# Patient Record
Sex: Female | Born: 1965 | Race: White | Hispanic: Yes | Marital: Married | Smoking: Never smoker
Health system: Southern US, Community
[De-identification: ages and names within clinical notes are randomized; demographics above are authoritative.]

## PROBLEM LIST (undated history)

## (undated) DIAGNOSIS — I10 Essential (primary) hypertension: Secondary | ICD-10-CM

## (undated) DIAGNOSIS — F419 Anxiety disorder, unspecified: Secondary | ICD-10-CM

## (undated) HISTORY — DX: Essential (primary) hypertension: I10

## (undated) HISTORY — DX: Anxiety disorder, unspecified: F41.9

---

## 2007-09-21 ENCOUNTER — Ambulatory Visit: Payer: Self-pay | Admitting: Cardiology

## 2007-09-21 ENCOUNTER — Encounter: Payer: Self-pay | Admitting: Cardiology

## 2007-09-21 ENCOUNTER — Ambulatory Visit: Payer: Self-pay

## 2010-12-14 NOTE — Assessment & Plan Note (Signed)
[facility]                            CARDIOLOGY OFFICE NOTE   NAME:[name], [name]                      MRN:          [id number]  DATE:09/21/2007                            DOB:          05-18-1966    REFERRING PHYSICIAN:  Dr. Thayer Ohm Guest.   REASON FOR REFERRAL:  Evaluate patient with a murmur and questionable  TIA.   HISTORY OF PRESENT ILLNESS:  The patient is a pleasant 45 year old  female, originally from [place].  She has had no prior cardiac history.  This morning she awoke with a little headache.  She thought maybe this  was because her menstrual period was starting.  However, she went to  work.  She had left arm and left leg numbness.  She did not describe  weakness.  She had no loss of vision or speech.  She said her head felt  funny and cold.  This all went on for about 20 minutes.  She went to  Urgent Care.  Her symptoms resolved, except she says she still feels a  little vague sense of discomfort in the left arm.  At the Urgent Care,  she was noted to have right-sided bruit versus murmur; she was referred  here.  We have done a head CT, noncontrasted, which shows no acute  findings preliminarily.  Also, an echocardiogram demonstrates no  valvular abnormalities, well-preserved ejection fraction and no other  significant findings.   The patient says she is otherwise well.  She has had no weakness,  palpitations, presyncope, chest discomfort, neck or arm discomfort.  She  is active.  She has no shortness of breath and denies any PND or  orthopnea.  She has never had these kind of symptoms before.   PAST MEDICAL HISTORY:  She has no history of hypertension, diabetes or  hyperlipidemia.   PAST SURGICAL HISTORY:  None.   ALLERGIES:  None.   MEDICATIONS:  None.   SOCIAL HISTORY:  The patient is married.  She has a 45 year old and 35-  year-old child.  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  She has never smoked  cigarettes.  She does not drink  alcohol.   FAMILY HISTORY:  Only contributory for her mother having hypertension.  There is no history of strokes, coronary disease or other vascular  problems.   REVIEW OF SYSTEMS:  As stated in the HPI and otherwise negative for all  other systems.  She did start her period today.   PHYSICAL EXAMINATION:  GENERAL:  The patient is in no distress.  Blood  pressure 131/93, heart rate 83 and regular.  HEENT:  Eyelids are unremarkable; pupils equal, round and reactive to  light; fundi within normal limits.  Oral mucosa remarkable.  NECK:  No jugular distention at 45 degrees; carotid upstroke brisk and  symmetric; right carotid, loud bruit, no left bruit; no thyromegaly.  LYMPHATICS:  No cervical, axillary or inguinal adenopathy.  LUNGS:  Clear to auscultation bilaterally.  BACK:  No costovertebral angle tenderness.  CHEST:  Unremarkable.  HEART:  PMI not displaced or sustained, S1 and S2 within normal limits,  no  S3, no S4, no clicks, no rubs, no murmurs.  ABDOMEN:  Flat.  Positive  bowel sounds, normal in frequency and pitch.  No bruits, no rebound, no  guarding, no midline pulsatile mass, no hepatomegaly, no splenomegaly.  SKIN:  No rashes, no nodules.  EXTREMITIES:  Pulses 2+ throughout, no edema, no cyanosis, no clubbing.  NEUROLOGIC:  Oriented to person, place and time, cranial nerves II-XII  grossly intact, motor intact throughout, sensory grossly intact.   IMPRESSION:  1. _________  carotid bruit.  The patient had the symptoms as      described above.  This could of been a transient ischemic attack,      though nothing was found on CT.  She has a normal neurological exam      now and her symptoms have almost completely resolved, though there      is still some vague sense of discomfort in that left upper arm.      Most interestingly, she does have a loud carotid bruit.  There is      no murmur and the echocardiogram was normal.  She would not be      somebody I would expect to  have obstructive disease, but could      have, but certainly we need to explain the bruit with a carotid      Doppler.  I have asked her to start taking an aspirin.  We are      going to get the carotid Doppler as soon as possible.  She has been      advised that if she has any further symptoms, she needs to present      immediately to the emergency room.  2. Risk reduction:  We can think about a lipid profile at some point      when she is fasting, as she has never had this done.  3.  Followup      will be after the carotid Doppler, or if she has any further      symptoms.     Rollene Rotunda, MD, [facility]  Electronically Signed    JH/MedQ  DD: 09/21/2007  DT: 09/23/2007  Job #: [id number]   cc:   Jonita Albee, M.D.  Rollene Rotunda, MD, [facility]

## 2011-02-22 ENCOUNTER — Ambulatory Visit: Payer: Self-pay | Admitting: Gynecology

## 2011-10-09 ENCOUNTER — Ambulatory Visit: Payer: Self-pay | Admitting: Family Medicine

## 2011-10-09 VITALS — BP 136/85 | HR 93 | Temp 98.1°F | Resp 16 | Ht 65.0 in | Wt 165.0 lb

## 2011-10-09 DIAGNOSIS — I10 Essential (primary) hypertension: Secondary | ICD-10-CM | POA: Insufficient documentation

## 2011-10-09 DIAGNOSIS — F419 Anxiety disorder, unspecified: Secondary | ICD-10-CM

## 2011-10-09 DIAGNOSIS — F418 Other specified anxiety disorders: Secondary | ICD-10-CM | POA: Insufficient documentation

## 2011-10-09 MED ORDER — TRIAMTERENE-HCTZ 37.5-25 MG PO TABS: 1.0000 | ORAL_TABLET | Freq: Every day | ORAL | Status: DC

## 2011-10-09 MED ORDER — AMLODIPINE BESYLATE 2.5 MG PO TABS: 2.5000 mg | ORAL_TABLET | Freq: Every day | ORAL | Status: DC

## 2011-10-09 MED ORDER — ALPRAZOLAM 0.25 MG PO TABS: 0.5000 mg | ORAL_TABLET | Freq: Every day | ORAL | Status: DC

## 2011-10-09 MED ORDER — CLONAZEPAM 1 MG PO TABS: 1.0000 mg | ORAL_TABLET | Freq: Every day | ORAL | Status: DC

## 2011-10-09 NOTE — Progress Notes (Signed)
Is a 46 year old woman who comes in with her husband. She is chronically anxious and needs her blood pressure medicine refilled. She speaks only Spanish and she no she needs a physical exam. Her main complaint is her anxiety. The clonazepam is helping her sleep and has been taking care of well since she started the clonazepam. For the past 6 months she's developed intermittent problems during the day particularly at work related to anxiety with her boss. Which takes the Xanax when necessary the symptoms immediately resolved.  Patient is anxious today but she calms down after being and we'll talk for a while. She notes that she gets tachycardic at work sometimes, but as noted the Xanax calms things down after 20 minutes.  Objective: HEENT unremarkable chest clear heart regular no murmur  Assessment: Chronic anxiety, blood pressure controlled  Plan: Labs ordered for complete physical exam on March 18 polypi complete physical exam on March 20  Meds refilled

## 2011-10-17 ENCOUNTER — Telehealth: Payer: Self-pay

## 2011-10-17 ENCOUNTER — Other Ambulatory Visit: Payer: Self-pay

## 2011-10-17 DIAGNOSIS — F419 Anxiety disorder, unspecified: Secondary | ICD-10-CM

## 2011-10-17 DIAGNOSIS — I10 Essential (primary) hypertension: Secondary | ICD-10-CM

## 2011-10-17 LAB — COMPREHENSIVE METABOLIC PANEL
ALT: 17 U/L (ref 0–35)
AST: 16 U/L (ref 0–37)
Albumin: 4.5 g/dL (ref 3.5–5.2)
Alkaline Phosphatase: 72 U/L (ref 39–117)
BUN: 16 mg/dL (ref 6–23)
CO2: 28 mEq/L (ref 19–32)
Calcium: 9.3 mg/dL (ref 8.4–10.5)
Chloride: 102 mEq/L (ref 96–112)
Creat: 0.56 mg/dL (ref 0.50–1.10)
Glucose, Bld: 91 mg/dL (ref 70–99)
Potassium: 3.9 mEq/L (ref 3.5–5.3)
Sodium: 139 mEq/L (ref 135–145)
Total Bilirubin: 0.4 mg/dL (ref 0.3–1.2)
Total Protein: 7.3 g/dL (ref 6.0–8.3)

## 2011-10-17 LAB — CBC WITH DIFFERENTIAL/PLATELET
Basophils Absolute: 0 10*3/uL (ref 0.0–0.1)
Basophils Relative: 0 % (ref 0–1)
Eosinophils Absolute: 0.1 10*3/uL (ref 0.0–0.7)
Eosinophils Relative: 1 % (ref 0–5)
HCT: 40.5 % (ref 36.0–46.0)
Hemoglobin: 13 g/dL (ref 12.0–15.0)
Lymphocytes Relative: 26 % (ref 12–46)
Lymphs Abs: 1.8 10*3/uL (ref 0.7–4.0)
MCH: 27.7 pg (ref 26.0–34.0)
MCHC: 32.1 g/dL (ref 30.0–36.0)
MCV: 86.2 fL (ref 78.0–100.0)
Monocytes Absolute: 0.6 10*3/uL (ref 0.1–1.0)
Monocytes Relative: 8 % (ref 3–12)
Neutro Abs: 4.4 10*3/uL (ref 1.7–7.7)
Neutrophils Relative %: 64 % (ref 43–77)
Platelets: 308 10*3/uL (ref 150–400)
RBC: 4.7 MIL/uL (ref 3.87–5.11)
RDW: 13.4 % (ref 11.5–15.5)
WBC: 6.9 10*3/uL (ref 4.0–10.5)

## 2011-10-17 LAB — TSH: TSH: 2.251 u[IU]/mL (ref 0.350–4.500)

## 2011-10-17 LAB — LIPID PANEL
Cholesterol: 227 mg/dL — ABNORMAL HIGH (ref 0–200)
HDL: 47 mg/dL (ref >39)
LDL Cholesterol: 148 mg/dL — ABNORMAL HIGH (ref 0–99)
Total CHOL/HDL Ratio: 4.8 Ratio
Triglycerides: 161 mg/dL — ABNORMAL HIGH (ref <150)
VLDL: 32 mg/dL (ref 0–40)

## 2011-11-07 ENCOUNTER — Ambulatory Visit: Payer: Self-pay | Admitting: Family Medicine

## 2011-11-07 VITALS — BP 131/92 | HR 101 | Temp 98.2°F | Resp 16 | Ht 65.0 in | Wt 163.0 lb

## 2011-11-07 DIAGNOSIS — J209 Acute bronchitis, unspecified: Secondary | ICD-10-CM

## 2011-11-07 DIAGNOSIS — J40 Bronchitis, not specified as acute or chronic: Secondary | ICD-10-CM

## 2011-11-07 DIAGNOSIS — R059 Cough, unspecified: Secondary | ICD-10-CM

## 2011-11-07 DIAGNOSIS — R05 Cough: Secondary | ICD-10-CM

## 2011-11-07 MED ORDER — GUAIFENESIN-CODEINE 100-10 MG/5ML PO SYRP: ORAL_SOLUTION | ORAL | Status: DC

## 2011-11-07 MED ORDER — AZITHROMYCIN 250 MG PO TABS: ORAL_TABLET | ORAL | Status: DC

## 2011-11-07 NOTE — Progress Notes (Signed)
  Subjective:    Patient ID: Carolynne Edouard, female    DOB: Sep 12, 1965, 46 y.o.   MRN: [id number]  HPI Genean Adamski is a 46 y.o. female Cough and nasal congestion for 2 weeks, now with sublective fever past 2 days, worse cough past 2 days. SH:  No known sick contacts.  Tx; Dayquil. Review of Systems  Constitutional: Positive for fever and chills.  HENT: Positive for ear pain and congestion. Negative for ear discharge.   Respiratory: Positive for cough. Negative for shortness of breath.   Cardiovascular: Negative for chest pain.  Musculoskeletal: Negative for arthralgias.       Objective:   Physical Exam  Constitutional: She is oriented to person, place, and time. She appears well-developed and well-nourished.  HENT:  Head: Normocephalic and atraumatic.  Eyes: Conjunctivae and EOM are normal. Pupils are equal, round, and reactive to light.  Cardiovascular: Normal rate, regular rhythm, normal heart sounds and intact distal pulses.   No murmur heard. Pulmonary/Chest: Effort normal. No respiratory distress.       Few coarse b.s. RLL, normal effort, no distress.  Neurological: She is alert and oriented to person, place, and time.  Skin: Skin is warm and dry. No rash noted.  Psychiatric: She has a normal mood and affect. Her behavior is normal.      Assessment & Plan:  Rashawna Scoles is a 46 y.o. female Cough, uri with secondary worsening,  Likely early community acquired pneumonia, vs bronchitis.   Start Z pak.  Mucinex QAM, Robitussin AC QHS prn (do not combine with xanax, SED). CXR declined tonight, but if not improving in next 2-3 days, or worsening sooner - return to clinic.  RTC precautions discussed, part of history and A/P discussed in spanish.

## 2012-03-31 ENCOUNTER — Ambulatory Visit: Payer: Self-pay | Admitting: Family Medicine

## 2012-03-31 VITALS — BP 124/82 | HR 82 | Temp 98.1°F | Resp 20 | Ht 65.0 in | Wt 160.0 lb

## 2012-03-31 DIAGNOSIS — F411 Generalized anxiety disorder: Secondary | ICD-10-CM

## 2012-03-31 DIAGNOSIS — F419 Anxiety disorder, unspecified: Secondary | ICD-10-CM

## 2012-03-31 DIAGNOSIS — E7800 Pure hypercholesterolemia, unspecified: Secondary | ICD-10-CM

## 2012-03-31 DIAGNOSIS — I10 Essential (primary) hypertension: Secondary | ICD-10-CM

## 2012-03-31 MED ORDER — ALPRAZOLAM 0.25 MG PO TABS: 0.2500 mg | ORAL_TABLET | Freq: Three times a day (TID) | ORAL | Status: AC | PRN

## 2012-03-31 MED ORDER — TRIAMTERENE-HCTZ 37.5-25 MG PO TABS: 1.0000 | ORAL_TABLET | Freq: Every day | ORAL | Status: DC

## 2012-03-31 MED ORDER — AMLODIPINE BESYLATE 2.5 MG PO TABS: 2.5000 mg | ORAL_TABLET | Freq: Every day | ORAL | Status: DC

## 2012-03-31 MED ORDER — CLONAZEPAM 1 MG PO TABS: ORAL_TABLET | ORAL | Status: DC

## 2012-03-31 NOTE — Progress Notes (Signed)
  Subjective:    Patient ID: [name] [name], female    DOB: [birth year], 45 y.o.   MRN: [id number]  HPI patient here for recheck of hypertension and anxiety she is doing well on her medications. She reports her anxiety is better with the use of Xanax and Klonopin.     Review of Systems     Objective:   Physical Exam Well developed well nourished female in no acute distress. She is not anxious appearing on exam and states she is well. Abdomen soft and non tender. Breath sounds normal bilaterally.  Heart regular without murmur      Assessment & Plan:  Maxide 25 and Norvasc 2.5 are renewed patient is also given Xanax to use prn and Klonopin for use at bedtime.  Patient can have meds refilled for one year if I am not available in March when she calls for refill.

## 2012-07-26 ENCOUNTER — Ambulatory Visit: Payer: Self-pay | Admitting: Family Medicine

## 2012-07-26 VITALS — BP 133/80 | HR 87 | Temp 98.2°F | Resp 16 | Ht 66.3 in | Wt 161.6 lb

## 2012-07-26 DIAGNOSIS — M549 Dorsalgia, unspecified: Secondary | ICD-10-CM

## 2012-07-26 DIAGNOSIS — M545 Low back pain, unspecified: Secondary | ICD-10-CM

## 2012-07-26 LAB — POCT URINALYSIS DIPSTICK
Bilirubin, UA: NEGATIVE
Blood, UA: NEGATIVE
Glucose, UA: NEGATIVE
Ketones, UA: NEGATIVE
Nitrite, UA: NEGATIVE
Protein, UA: NEGATIVE
Spec Grav, UA: <=1.005
Urobilinogen, UA: 0.2
pH, UA: 5

## 2012-07-26 LAB — POCT UA - MICROSCOPIC ONLY
Casts, Ur, LPF, POC: NEGATIVE
Crystals, Ur, HPF, POC: NEGATIVE
Mucus, UA: NEGATIVE
RBC, urine, microscopic: NEGATIVE
Renal tubular cells: POSITIVE
Yeast, UA: NEGATIVE

## 2012-07-26 LAB — POCT URINE PREGNANCY: Preg Test, Ur: NEGATIVE

## 2012-07-26 MED ORDER — KETOPROFEN 50 MG PO CAPS: 50.0000 mg | ORAL_CAPSULE | Freq: Four times a day (QID) | ORAL | Status: DC | PRN

## 2012-07-26 NOTE — Patient Instructions (Addendum)
Distensin lumbo-sacra (Lumbosacral Strain) Distensin lumbo-sacra es una de las causas ms frecuentes de dolor de espalda. Hay numerosas causas que originan el dolor de espalda. La mayora no son enfermedades graves.  CAUSAS La columna vertebral (espina dorsal) est conformada por 24 vrtebras principales, adems del sacro y el cccix que estn unidas entre s por tejidos fibrosos y resistentes denominados ligamentos, y sostenidas por los msculos. Las races nerviosas pasan a travs de los agujeros entre las vrtebras. Un movimiento brusco o un traumatismo en la espalda pueden ocasionar daos a estos nervios o presin sobre ellos. Esto puede traer como consecuencia un dolor localizado en la espalda o un dolor que se irradia (se desplaza) hacia los glteos y por la pierna hasta el pie. Este trastorno, conocido como citica, est asociado frecuentemente a una ruptura (hernia) del disco. El dolor tambin puede estar causado slo por el espasmo muscular. El mdico a menudo podr encontrar la causa del dolor a partir de la informacin detallada de los sntomas y un examen fsico. En algunos casos, podr necesitar estudios (como radiografas), pero podran no ser los adecuados para usted. El profesional que lo asiste determinar si se necesitan otros estudios, segn el examen fsico especfico. INSTUCCIONES PARA EL CUIDADO DOMICILIARIO:  Evite el estilo de vida sedentario. El ejercicio activo, siguiendo las indicaciones del profesional que le asiste, es el arma ms importante con que cuenta para luchar contra el dolor de espalda.  Si no se encuentra en buen estado fsico, evite las actividades intensas (tenis, frontn, esqu acutico). Esto podra crear o agravar el problema.  Si siente dolor en la espalda, es importante evitar los deportes que requieran de movimientos corporales bruscos. La natacin y las caminatas son las actividades ms seguras.  Mantenga una buena postura.  Evite el sobrepeso  (obesidad).  Haga reposo en cama slo en caso de los episodios repentinos ms extremos y agudos. El profesional que lo asiste ayudar a determinar cunto reposo le conviene.  En los casos agudos, coloque hielo en la zona lesionada.  Ponga el hielo en una bolsa plstica.  Colquese una toalla entre la piel y la bolsa.  Aplique el hielo 15 a 20 minutos por vez, cada dos horas o segn sea necesario.  Una vez que ha mejorado y est ms activo, ser til aplicar calor durante 30 minutos antes de las actividades. Consulte con un mdico si el dolor se prolonga ms de lo esperado. El mdico podr ayudar o aconsejarle ejercicios adecuados o terapia si esta es necesaria. Con un buen entrenamiento fsico, podr evitar la mayor parte de los problemas de la espalda.  SOLICITE ATENCIN MDICA DE INMEDIATO SI:  Presenta entumecimiento, hormigueo, debilidad o algn problema en los brazos o las piernas.  Siente un dolor de espalda intenso que no mejora con medicamentos.  Observa cambios en el control del intestino o la vejiga.  Usted presenta dolor en algunas zonas del cuerpo que incluyen el estmago o el abdomen.  Sufre falta de aire, mareos o desmayos.  Comienza a sentir nuseas (ganas de vomitar), vmitos o sudoracin.  Decoloracin de sus dedos o piernas o pies que se ponen muy fros.  El dolor de espalda empeora.  Tiene fiebre. EST SEGURO QUE:   Comprende las instrucciones para el alta mdica.  Controlar su enfermedad.  Solicitar atencin mdica de inmediato segn las indicaciones. Document Released: 04/27/2005 Document Revised: 10/10/2011 [employer] Patient Information 2013 [employer].  

## 2012-07-26 NOTE — Progress Notes (Signed)
This is a 46 year old Hispanic woman comes in because of low back pain which migrates from side to side of the last 2 weeks. She seen with her husband and the room. She has no urinary symptoms. She's had no injury. The pain is worse when she bends over she does not have any pain radiating down her legs and she has no abdominal symptoms such as nausea vomiting or diarrhea. She was in the bathroom #2 twice a day. The pain seemed to be burning in nature in the morning but then becomes an ache later in the day. He's had no weakness in the legs.  Objective: No acute distress SLR normal Patient can bend over and touch her toes without problem. She has no localized tenderness in her back  Range of motion of both legs is normal Results for orders placed in visit on 07/26/12  POCT URINALYSIS DIPSTICK      Component Value Range   Color, UA clear     Clarity, UA clear     Glucose, UA neg     Bilirubin, UA neg     Ketones, UA neg     Spec Grav, UA <=1.005     Blood, UA neg     pH, UA 5.0     Protein, UA neg     Urobilinogen, UA 0.2     Nitrite, UA neg     Leukocytes, UA small (1+)    POCT UA - MICROSCOPIC ONLY      Component Value Range   WBC, Ur, HPF, POC 1-6     RBC, urine, microscopic neg     Bacteria, U Microscopic trace     Mucus, UA neg     Epithelial cells, urine per micros 3-16     Crystals, Ur, HPF, POC neg     Casts, Ur, LPF, POC neg     Yeast, UA neg     Renal tubular cells positive    POCT URINE PREGNANCY      Component Value Range   Preg Test, Ur Negative       Assessment: muscular type pain, possibly a swollen disc  Plan

## 2012-07-28 LAB — URINE CULTURE: Colony Count: >=100000

## 2012-07-30 ENCOUNTER — Telehealth: Payer: Self-pay

## 2012-07-30 NOTE — Telephone Encounter (Signed)
Message copied by Johnnette Litter on Mon Jul 30, 2012  8:20 PM ------      Message from: [name]      Created: Sun Jul 29, 2012  8:16 PM       Please notify patient of positive urine culture and importance of finishing antibiotics.  Return if symptoms continue.

## 2012-07-30 NOTE — Telephone Encounter (Signed)
Dr. Elbert Ewings, pt was never put on an abx. She is feeling a little better with the Ketoprofen, but wants to know if you think she should take an abx.

## 2012-07-31 NOTE — Telephone Encounter (Signed)
Patient should be rechecked at this point

## 2012-08-03 NOTE — Telephone Encounter (Signed)
Spoke to pt and she stated she is feeling better and Sxs have resolved at this point. I advised pt to RTC for recheck if her Sxs return. Pt agreed.

## 2012-09-01 ENCOUNTER — Other Ambulatory Visit: Payer: Self-pay | Admitting: Family Medicine

## 2012-09-01 NOTE — Telephone Encounter (Signed)
DR. Milus Glazier TOLD PATIENT HE WAS GOING TO REFILL HER XANAX ONE MORE TIME BUT SHE HAS NO REFILL AT PHARMACY. COULD SOMEONE CALL IT IN? SHE SAYS SHE BEEN TAKING TWO DAILY AND HAS RAN OUT

## 2012-09-02 ENCOUNTER — Other Ambulatory Visit: Payer: Self-pay | Admitting: Family Medicine

## 2012-09-03 ENCOUNTER — Ambulatory Visit (INDEPENDENT_AMBULATORY_CARE_PROVIDER_SITE_OTHER): Payer: BC Managed Care – PPO | Admitting: Family Medicine

## 2012-09-03 VITALS — BP 127/88 | HR 80 | Temp 97.9°F | Resp 16 | Ht 66.0 in | Wt 156.0 lb

## 2012-09-03 DIAGNOSIS — G4700 Insomnia, unspecified: Secondary | ICD-10-CM

## 2012-09-03 DIAGNOSIS — F410 Panic disorder [episodic paroxysmal anxiety] without agoraphobia: Secondary | ICD-10-CM

## 2012-09-03 MED ORDER — CLONAZEPAM 1 MG PO TABS: 1.0000 mg | ORAL_TABLET | Freq: Every day | ORAL | Status: DC

## 2012-09-03 MED ORDER — BUSPIRONE HCL 5 MG PO TABS: 5.0000 mg | ORAL_TABLET | Freq: Two times a day (BID) | ORAL | Status: DC | PRN

## 2012-09-03 NOTE — Progress Notes (Signed)
47 year old Hispanic woman who is chronically anxious. I had her on clonazepam for sleep which is working well. Nevertheless the alprazolam pacemaker little drowsy and does not really work controlling the palpitations which occur only in the afternoon. She's find her in the morning. She describes as panic attack like. She has no GERD symptoms.  Objective: Lungs clear Heart: Regular no murmur or gallop  Assessment panic disorder  Plan: Refill clonazepam, trial of BuSpar 5 mg twice a day when necessary. I've asked her to take 1 every day at noon and repeat later in the afternoon necessary

## 2013-02-17 ENCOUNTER — Ambulatory Visit (INDEPENDENT_AMBULATORY_CARE_PROVIDER_SITE_OTHER): Payer: BC Managed Care – PPO | Admitting: Family Medicine

## 2013-02-17 ENCOUNTER — Telehealth: Payer: Self-pay | Admitting: Family Medicine

## 2013-02-17 VITALS — BP 122/74 | HR 79 | Temp 98.0°F | Resp 17 | Ht 65.5 in | Wt 161.0 lb

## 2013-02-17 DIAGNOSIS — I10 Essential (primary) hypertension: Secondary | ICD-10-CM

## 2013-02-17 DIAGNOSIS — G4700 Insomnia, unspecified: Secondary | ICD-10-CM

## 2013-02-17 DIAGNOSIS — F410 Panic disorder [episodic paroxysmal anxiety] without agoraphobia: Secondary | ICD-10-CM

## 2013-02-17 DIAGNOSIS — R011 Cardiac murmur, unspecified: Secondary | ICD-10-CM

## 2013-02-17 LAB — POCT CBC
Granulocyte percent: 64 %G (ref 37–80)
HCT, POC: 36.1 % — AB (ref 37.7–47.9)
Hemoglobin: 11 g/dL — AB (ref 12.2–16.2)
Lymph, poc: 2.1 (ref 0.6–3.4)
MCH, POC: 23.4 pg — AB (ref 27–31.2)
MCHC: 30.5 g/dL — AB (ref 31.8–35.4)
MCV: 76.8 fL — AB (ref 80–97)
MID (cbc): 0.6 (ref 0–0.9)
MPV: 9.8 fL (ref 0–99.8)
POC Granulocyte: 4.7 (ref 2–6.9)
POC LYMPH PERCENT: 28.2 %L (ref 10–50)
POC MID %: 7.8 %M (ref 0–12)
Platelet Count, POC: 358 10*3/uL (ref 142–424)
RBC: 4.7 M/uL (ref 4.04–5.48)
RDW, POC: 16.7 %
WBC: 7.4 10*3/uL (ref 4.6–10.2)

## 2013-02-17 LAB — COMPREHENSIVE METABOLIC PANEL
ALT: 24 U/L (ref 0–35)
AST: 22 U/L (ref 0–37)
Albumin: 4.6 g/dL (ref 3.5–5.2)
Alkaline Phosphatase: 76 U/L (ref 39–117)
BUN: 12 mg/dL (ref 6–23)
CO2: 26 mEq/L (ref 19–32)
Calcium: 9.3 mg/dL (ref 8.4–10.5)
Chloride: 101 mEq/L (ref 96–112)
Creat: 0.64 mg/dL (ref 0.50–1.10)
Glucose, Bld: 130 mg/dL — ABNORMAL HIGH (ref 70–99)
Potassium: 4 mEq/L (ref 3.5–5.3)
Sodium: 136 mEq/L (ref 135–145)
Total Bilirubin: 0.5 mg/dL (ref 0.3–1.2)
Total Protein: 7.4 g/dL (ref 6.0–8.3)

## 2013-02-17 LAB — LIPID PANEL
Cholesterol: 196 mg/dL (ref 0–200)
HDL: 41 mg/dL (ref >39)
LDL Cholesterol: 121 mg/dL — ABNORMAL HIGH (ref 0–99)
Total CHOL/HDL Ratio: 4.8 Ratio
Triglycerides: 172 mg/dL — ABNORMAL HIGH (ref <150)
VLDL: 34 mg/dL (ref 0–40)

## 2013-02-17 MED ORDER — TRIAMTERENE-HCTZ 37.5-25 MG PO TABS: 1.0000 | ORAL_TABLET | Freq: Every day | ORAL | Status: DC

## 2013-02-17 MED ORDER — CLONAZEPAM 1 MG PO TABS: 1.0000 mg | ORAL_TABLET | Freq: Every day | ORAL | Status: DC

## 2013-02-17 MED ORDER — BUSPIRONE HCL 5 MG PO TABS: 5.0000 mg | ORAL_TABLET | Freq: Two times a day (BID) | ORAL | Status: DC | PRN

## 2013-02-17 MED ORDER — AMLODIPINE BESYLATE 2.5 MG PO TABS: 2.5000 mg | ORAL_TABLET | Freq: Every day | ORAL | Status: DC

## 2013-02-17 NOTE — Patient Instructions (Addendum)
Soplo cardaco (Heart Murmur) Un soplo cardaco en un nio suele ser inofensivo y no causar daos. La causa de estos soplos es la turbulencia que se produce cuando la sangre atraviesa el corazn. Ocurren en algn momento en ms de la mitad de todos los nios antes de Psychologist, educational. Se denominan soplos funcionales y no requieren tratamiento ni restriccin de las actividades ni de la participacin en deportes. Algunos soplos en los nios estn causados por un pequeo agujero o defecto en la pared que se encuentra entre dos cavidades cardacas. Este tipo de defecto suele cerrarse con el paso del tiempo a medida que el nio crece. Puede ser que sea necesario que se le administren antibiticos. En los adultos, un soplo cardaco se debe con ms frecuencia a problemas en las vlvulas que separan las cavidades cardacas. Estas cuatro cavidades se abren y se cierran de modo coordinado y [employer] en movimiento a medida que el corazn late. Se denomina "estenosis" al trastorno por el cual una cavidad no se abre adecuadamente. Se denomina "regurgitacin" al trastorno por el cual una vlvula no retiene [employer] cavidad. Los soplos causados por problemas en las cavidades cardacas suenan diferente de aquellos que son inofensivos o funcionales. Las vlvulas anormales pueden causar serios problemas, como falta de aire, dolor en el pecho, insuficiencia cardaca y [place]. Tambin pueden infectarse, de modo que si usted sufre un problema en una vlvula, debe tomar antibiticos antes y despus de cualquier procedimiento o limpieza dentales.  Hallar la causa de un soplo cardaco puede necesitar varios exmenes, someterse a un ECG o a una radiografa de trax. El ecocardiograma es una prueba del corazn en la que se utiliza el [place], y que es muy til para Development worker, community las cavidades. Si usted padece este trastorno, ser necesario que tome medicamentos y si [place] una vlvula muy estrecha o que no  retuviera la sangre adecuadamente podra requerir [place]. Consulte al profesional que lo asiste para Charity fundraiser de seguimiento y otras pruebas que le hayan indicado. SOLICITE ATENCIN MDICA DE INMEDIATO SI:  Comienza a Administrator, arts.  Le sube la fiebre sin otra causa.  Empeora alguno de los problemas que lo hicieron consultar o que lo hicieron llevar a su hijo a Youth worker. Document Released: 07/18/2005 Document Revised: 10/10/2011 [facility] Patient Information 2014 [place], [place].

## 2013-02-17 NOTE — Telephone Encounter (Signed)
Faxed Rx for klonopin

## 2013-02-17 NOTE — Progress Notes (Signed)
47 year old woman with panic disorder. She comes in for refills of medications. Devices she's not needing to take the BuSpar as much. She takes it it takes maybe 10 minutes start working and she has about a half an hour of generalized weakness followed by resolution. Inducible the clonazepam at night.  Objective: HEENT: Normal Neck: Supple no adenopathy or bruits Chest: Clear Heart: Patient is a 2/6 systolic soft blowing early murmur best heard right sternal border. Skin: Patient has a yellow discoloration of her palms and soles of her feet. HEENT: No icterus.  Assessment: Patient has unusual discoloration of her palms and soles which may be secondary to eating carrots and carried to this. The soft murmur appears to be functional in nature but this for some of her head so I think she had a cardiogram.  Plan:Hypertension - Plan: Comprehensive metabolic panel, POCT CBC, 2D Echocardiogram without contrast, Lipid panel, amLODipine (NORVASC) 2.5 MG tablet, triamterene-hydrochlorothiazide (MAXZIDE-25) 37.5-25 MG per tablet, CANCELED: Comprehensive metabolic panel, CANCELED: POCT CBC  Insomnia - Plan: clonazePAM (KLONOPIN) 1 MG tablet  Panic disorder - Plan: busPIRone (BUSPAR) 5 MG tablet  Signed, [name], MD

## 2013-03-06 ENCOUNTER — Other Ambulatory Visit (HOSPITAL_COMMUNITY): Payer: Self-pay

## 2013-04-06 ENCOUNTER — Ambulatory Visit (INDEPENDENT_AMBULATORY_CARE_PROVIDER_SITE_OTHER): Payer: BC Managed Care – PPO | Admitting: Family Medicine

## 2013-04-06 VITALS — BP 132/78 | HR 92 | Temp 98.2°F | Resp 18

## 2013-04-06 DIAGNOSIS — R011 Cardiac murmur, unspecified: Secondary | ICD-10-CM

## 2013-04-06 DIAGNOSIS — R0989 Other specified symptoms and signs involving the circulatory and respiratory systems: Secondary | ICD-10-CM

## 2013-04-06 NOTE — Progress Notes (Signed)
47 year old woman with chronic anxiety and panic disorder who comes in because of increase in her symptoms. She's having rapid heartbeat and some substernal chest pressure at times. Earlier this summer I refilled her medicine and she seems to be a little worse at that time but not significantly bad, so we continued her anxiety medicine.  Objective: No acute distress Chest: Clear Heart: 3/6 systolic murmur best heard at the right sternal border with radiation to the right carotid. Extremities: No edema  Assessment. Have a long discussion with patient and her husband, I explained that she actually needed to have an ultrasound at this point you know she didn't have insurance. She's very reluctant to undergo more testing because of the expense but I explained that without knowing what's going on with a valve, I'm flying blind.  Plan: Urgent ultrasound of heart and carotid.  Signed, Sheila Oats.D.

## 2013-04-08 ENCOUNTER — Telehealth: Payer: Self-pay | Admitting: Radiology

## 2013-04-08 NOTE — Telephone Encounter (Signed)
Sheppard Plumber asked me to precert this on [employer], I have done this, it is in progress, will check back on the website to see if it is authorized, then I will let you know.

## 2013-04-08 NOTE — Telephone Encounter (Signed)
Spoke to [facility] regarding precert for echo the authorization # is [id number] valid for 30 days. To you FYI

## 2013-04-08 NOTE — Telephone Encounter (Signed)
Called [facility] vasc lab back and was able to schedule appt for pt's echo for 04/09/13 at 4 pm. Notified pt who agreed to appt and gave her instr's and clarified w/her son at her request d/t language barrier.

## 2013-04-08 NOTE — Telephone Encounter (Signed)
Patient calling indicating she is to have urgent referral, but there is no referral, what is she referring to?

## 2013-04-09 ENCOUNTER — Ambulatory Visit (HOSPITAL_COMMUNITY)
Admission: RE | Admit: 2013-04-09 | Discharge: 2013-04-09 | Disposition: A | Discharge status: Home / Self Care | Payer: BC Managed Care – PPO | Source: Ambulatory Visit | Attending: Family Medicine | Admitting: Family Medicine

## 2013-04-09 DIAGNOSIS — R011 Cardiac murmur, unspecified: Secondary | ICD-10-CM | POA: Insufficient documentation

## 2013-04-09 DIAGNOSIS — R0989 Other specified symptoms and signs involving the circulatory and respiratory systems: Secondary | ICD-10-CM

## 2013-04-09 NOTE — Progress Notes (Signed)
Echocardiogram 2D Echocardiogram has been performed.  [name] [name] 04/09/2013, 4:58 PM

## 2013-04-10 ENCOUNTER — Other Ambulatory Visit: Payer: Self-pay | Admitting: Family Medicine

## 2013-04-10 DIAGNOSIS — R002 Palpitations: Secondary | ICD-10-CM

## 2013-04-10 MED ORDER — DILTIAZEM HCL ER COATED BEADS 180 MG PO TB24: 180.0000 mg | ORAL_TABLET | Freq: Every day | ORAL | Status: DC

## 2013-04-11 ENCOUNTER — Ambulatory Visit (INDEPENDENT_AMBULATORY_CARE_PROVIDER_SITE_OTHER): Payer: BC Managed Care – PPO | Admitting: Family Medicine

## 2013-04-11 ENCOUNTER — Encounter: Payer: Self-pay | Admitting: Family Medicine

## 2013-04-11 VITALS — BP 122/72 | HR 76 | Temp 98.1°F | Resp 16 | Ht 64.5 in | Wt 159.6 lb

## 2013-04-11 DIAGNOSIS — R002 Palpitations: Secondary | ICD-10-CM

## 2013-04-11 DIAGNOSIS — F411 Generalized anxiety disorder: Secondary | ICD-10-CM

## 2013-04-11 DIAGNOSIS — I10 Essential (primary) hypertension: Secondary | ICD-10-CM

## 2013-04-11 MED ORDER — VERAPAMIL HCL 120 MG PO TABS
120.0000 mg | ORAL_TABLET | Freq: Every day | ORAL | Status: DC
Start: 2013-04-11 — End: 2013-10-20

## 2013-04-11 MED ORDER — VERAPAMIL HCL 120 MG PO TABS: 120.0000 mg | ORAL_TABLET | Freq: Three times a day (TID) | ORAL | Status: DC

## 2013-04-11 NOTE — Progress Notes (Signed)
47 year old woman with a loud murmur who is evaluated with a echocardiogram earlier this week. The echo did not show any valvular problems or narrowing of the great vessels.  I called in a prescription for Cardizem to his patient been having palpitations. She says the prescription costs over $60 a month and she can't afford it. She also says that she's getting somnolent with the BuSpar medicine and wants an option for it in half. In general she likes to do things more "naturally" and is less medicine.  Patient has been sleeping well with clonazepam. She has had a problems with the Norvasc because the palpitations we will try to find something that would work better. She's had problems with metoprolol before because of made her tired.  I spent half an hour talking to the patient about these different problems, trying to get the right combination medicine that would control her palpitations, or hypertension, and her nerves.  We decided that she could break the BuSpar and take it when necessary, take the clonazepam 1 mg at at bedtime to sleep better, switch to Norvasc to verapamil 120 every morning with followup blood pressures to make sure that she's not getting hypertensive on low dose of verapamil.  Signed, Sheila Oats.D.

## 2013-10-20 ENCOUNTER — Ambulatory Visit (INDEPENDENT_AMBULATORY_CARE_PROVIDER_SITE_OTHER): Payer: No Typology Code available for payment source | Admitting: Family Medicine

## 2013-10-20 ENCOUNTER — Encounter: Payer: Self-pay | Admitting: Family Medicine

## 2013-10-20 VITALS — BP 132/80 | HR 90 | Temp 98.2°F | Resp 18 | Ht 65.0 in | Wt 161.2 lb

## 2013-10-20 DIAGNOSIS — I10 Essential (primary) hypertension: Secondary | ICD-10-CM

## 2013-10-20 DIAGNOSIS — G4700 Insomnia, unspecified: Secondary | ICD-10-CM

## 2013-10-20 DIAGNOSIS — D649 Anemia, unspecified: Secondary | ICD-10-CM

## 2013-10-20 LAB — POCT CBC
Granulocyte percent: 63.7 %G (ref 37–80)
HCT, POC: 38.4 % (ref 37.7–47.9)
Hemoglobin: 11.9 g/dL — AB (ref 12.2–16.2)
Lymph, poc: 2.5 (ref 0.6–3.4)
MCH, POC: 24.1 pg — AB (ref 27–31.2)
MCHC: 31 g/dL — AB (ref 31.8–35.4)
MCV: 77.7 fL — AB (ref 80–97)
MID (cbc): 0.4 (ref 0–0.9)
MPV: 9.1 fL (ref 0–99.8)
POC Granulocyte: 5.1 (ref 2–6.9)
POC LYMPH PERCENT: 30.9 %L (ref 10–50)
POC MID %: 5.4 %M (ref 0–12)
Platelet Count, POC: 367 10*3/uL (ref 142–424)
RBC: 4.94 M/uL (ref 4.04–5.48)
RDW, POC: 19.1 %
WBC: 8 10*3/uL (ref 4.6–10.2)

## 2013-10-20 MED ORDER — TRIAMTERENE-HCTZ 37.5-25 MG PO TABS: 1.0000 | ORAL_TABLET | Freq: Every day | ORAL | Status: DC

## 2013-10-20 MED ORDER — AMLODIPINE BESYLATE 2.5 MG PO TABS: 2.5000 mg | ORAL_TABLET | Freq: Every day | ORAL | Status: DC

## 2013-10-20 MED ORDER — CLONAZEPAM 1 MG PO TABS: 1.0000 mg | ORAL_TABLET | Freq: Every day | ORAL | Status: DC

## 2013-10-20 NOTE — Progress Notes (Signed)
This chart was scribed for [name], MD by Quintella Reichert, ED scribe.  This patient was seen in [facility] Room 2 and the patient's care was started at 10:38 AM.  @UMFCLOGO @  Patient ID: [name] [name] MRN: [id number], DOB: [birth year], 48 y.o. Date of Encounter: 10/20/2013, 10:47 AM  Primary Physician: [name], MD  Chief Complaint: Medication Refills  HPI: 48 y.o. year old female with history below presents for a refill on all of her medications.  Pt states her Buspar makes her feel funny.   Pt recently had a trip to [place] which went well.  She is seen with her husband today.  She continues to complain of chronic anxiety.    Her BP has been well-controlled.  She checks it at [facility].   Past Medical History  Diagnosis Date   Anxiety    Hypertension      Home Meds: Prior to Admission medications   Medication Sig Start Date End Date Taking? Authorizing Provider  busPIRone (BUSPAR) 5 MG tablet Take 1 tablet (5 mg total) by mouth 2 (two) times daily as needed. 02/17/13   [name], MD  clonazePAM (KLONOPIN) 1 MG tablet Take 1 tablet (1 mg total) by mouth at bedtime. 02/17/13   [name], MD  triamterene-hydrochlorothiazide (MAXZIDE-25) 37.5-25 MG per tablet Take 1 tablet by mouth daily. 02/17/13 02/17/14  [name], MD  verapamil (CALAN) 120 MG tablet Take 1 tablet (120 mg total) by mouth daily. 04/11/13   [name], MD    Allergies:  Allergies  Allergen Reactions   Doxycycline     History   Social History   Marital Status: Married    Spouse Name: N/A    Number of Children: N/A   Years of Education: N/A   Occupational History   Not on file.   Social History Main Topics   Smoking status: Never Smoker    Smokeless tobacco: Not on file   Alcohol Use: No   Drug Use: No   Sexual Activity: No   Other Topics Concern   Not on file   Social History Narrative   No narrative on file     Review of  Systems: Constitutional: negative for chills, fever, night sweats, weight changes, or fatigue  HEENT: negative for vision changes, hearing loss, congestion, rhinorrhea, ST, epistaxis, or sinus pressure Cardiovascular: negative for chest pain or palpitations Respiratory: negative for hemoptysis, wheezing, shortness of breath, or cough Abdominal: negative for abdominal pain, nausea, vomiting, diarrhea, or constipation Dermatological: negative for rash Neurologic: negative for headache, dizziness, or syncope Psychiatric: positive for anxiety All other systems reviewed and are otherwise negative with the exception to those above and in the HPI.   Physical Exam There were no vitals taken for this visit., There is no weight on file to calculate BMI. General: Well developed, well nourished, in no acute distress. Head: Normocephalic, atraumatic, eyes without discharge, sclera non-icteric, nares are without discharge. Bilateral auditory canals clear, TM's are without perforation, pearly grey and translucent with reflective cone of light bilaterally. Oral cavity moist, posterior pharynx without exudate, erythema, peritonsillar abscess, or post nasal drip.  Neck: Supple. No thyromegaly. Full ROM. No lymphadenopathy. Lungs: Clear bilaterally to auscultation without wheezes, rales, or rhonchi. Breathing is unlabored. Heart: RRR with S1 S2. Soft 2/6 murmurs, no rubs, or gallops appreciated. Abdomen: Soft, non-tender, non-distended with normoactive bowel sounds. No hepatomegaly. No rebound/guarding. No obvious abdominal masses. Msk:  Strength and tone normal for age. Extremities/Skin: Warm and dry. No clubbing or cyanosis.  No edema. No rashes or suspicious lesions. Neuro: Alert and oriented X 3. Moves all extremities spontaneously. Gait is normal. CNII-XII grossly in tact. Psych:  Responds to questions appropriately with a normal affect.   Labs:  Results for orders placed in visit on 10/20/13  POCT CBC       Result Value Ref Range   WBC 8.0  4.6 - 10.2 K/uL   Lymph, poc 2.5  0.6 - 3.4   POC LYMPH PERCENT 30.9  10 - 50 %L   MID (cbc) 0.4  0 - 0.9   POC MID % 5.4  0 - 12 %M   POC Granulocyte 5.1  2 - 6.9   Granulocyte percent 63.7  37 - 80 %G   RBC 4.94  4.04 - 5.48 M/uL   Hemoglobin 11.9 (*) 12.2 - 16.2 g/dL   HCT, POC 16.138.4  09.637.7 - 47.9 %   MCV 77.7 (*) 80 - 97 fL   MCH, POC 24.1 (*) 27 - 31.2 pg   MCHC 31.0 (*) 31.8 - 35.4 g/dL   RDW, POC 04.519.1     Platelet Count, POC 367  142 - 424 K/uL   MPV 9.1  0 - 99.8 fL      ASSESSMENT AND PLAN:  48 y.o. year old female with   1. Hypertension   2. Insomnia   3. Anemia    Refill Rx.  Signed, Elvina SidleKurt Lauenstein, MD 10/20/2013 10:47 AM

## 2013-11-14 ENCOUNTER — Other Ambulatory Visit: Payer: Self-pay

## 2013-11-14 DIAGNOSIS — I10 Essential (primary) hypertension: Secondary | ICD-10-CM

## 2013-11-14 NOTE — Telephone Encounter (Signed)
Rec request for med refill  

## 2013-11-14 NOTE — Telephone Encounter (Signed)
Rec request for refill. 

## 2013-11-18 ENCOUNTER — Other Ambulatory Visit: Payer: Self-pay

## 2013-11-18 DIAGNOSIS — I10 Essential (primary) hypertension: Secondary | ICD-10-CM

## 2013-11-18 NOTE — Telephone Encounter (Signed)
Received request for refill. 

## 2016-08-29 DIAGNOSIS — D509 Iron deficiency anemia, unspecified: Secondary | ICD-10-CM | POA: Insufficient documentation

## 2016-08-29 DIAGNOSIS — Z1211 Encounter for screening for malignant neoplasm of colon: Secondary | ICD-10-CM | POA: Insufficient documentation

## 2016-08-29 DIAGNOSIS — Z23 Encounter for immunization: Secondary | ICD-10-CM | POA: Insufficient documentation

## 2016-08-29 DIAGNOSIS — E559 Vitamin D deficiency, unspecified: Secondary | ICD-10-CM | POA: Insufficient documentation

## 2017-10-14 ENCOUNTER — Encounter: Payer: Self-pay | Admitting: Urgent Care

## 2017-10-14 ENCOUNTER — Other Ambulatory Visit: Payer: Self-pay

## 2017-10-14 ENCOUNTER — Ambulatory Visit: Payer: BLUE CROSS/BLUE SHIELD | Admitting: Urgent Care

## 2017-10-14 VITALS — BP 120/90 | HR 80 | Temp 98.5°F | Resp 16 | Ht 65.0 in | Wt 158.4 lb

## 2017-10-14 DIAGNOSIS — R0989 Other specified symptoms and signs involving the circulatory and respiratory systems: Secondary | ICD-10-CM | POA: Diagnosis not present

## 2017-10-14 DIAGNOSIS — R05 Cough: Secondary | ICD-10-CM

## 2017-10-14 DIAGNOSIS — R5383 Other fatigue: Secondary | ICD-10-CM | POA: Diagnosis not present

## 2017-10-14 DIAGNOSIS — R059 Cough, unspecified: Secondary | ICD-10-CM

## 2017-10-14 MED ORDER — AZITHROMYCIN 250 MG PO TABS: ORAL_TABLET | ORAL | 0 refills | Status: DC

## 2017-10-14 MED ORDER — HYDROCODONE-HOMATROPINE 5-1.5 MG/5ML PO SYRP: 5.0000 mL | ORAL_SOLUTION | Freq: Every evening | ORAL | 0 refills | Status: DC | PRN

## 2017-10-14 MED ORDER — BENZONATATE 100 MG PO CAPS: 100.0000 mg | ORAL_CAPSULE | Freq: Three times a day (TID) | ORAL | 0 refills | Status: DC | PRN

## 2017-10-14 NOTE — Progress Notes (Addendum)
   MRN: [id number] DOB: May 03, 1966  Subjective:   [name] [name] is a 52 y.o. female presenting for 2 week history of productive cough, fatigue, sinus congestion. Cough is eliciting sore throat. Has Mucinex, DayQuil without any relief. Denies fever, chest pain, shob, sinus pain, ear pain, n/v, abdominal pain. Denies smoking cigarettes or drinking alcohol. Denies history of asthma, seasonal allergies.  [name] has a current medication list which includes the following prescription(s): amlodipine, vitamin d3, ferrous sulfate, and triamterene-hydrochlorothiazide. Also is allergic to doxycycline.  [name]  has a past medical history of Anxiety and Hypertension. Denies past surgical history.   Objective:   Vitals: BP 120/90   Pulse 80   Temp 98.5 F (36.9 C)   Resp 16   Ht 5\' 5"  (1.651 m)   Wt 158 lb 6.4 oz (71.8 kg)   SpO2 99%   BMI 26.36 kg/m   Physical Exam  Constitutional: She is oriented to person, place, and time. She appears well-developed and well-nourished.  HENT:  Right Ear: Tympanic membrane normal.  Left Ear: Tympanic membrane normal.  Nose: No rhinorrhea or sinus tenderness.  Mouth/Throat: Oropharynx is clear and moist.  Eyes: Right eye exhibits no discharge. Left eye exhibits no discharge.  Cardiovascular: Normal rate, regular rhythm and intact distal pulses. Exam reveals no gallop and no friction rub.  No murmur heard. Pulmonary/Chest: No respiratory distress. She has no wheezes. She has no rales.  Neurological: She is alert and oriented to person, place, and time.  Skin: Skin is warm and dry.  Psychiatric: She has a normal mood and affect.   Assessment and Plan :   Cough  Chest congestion  Other fatigue  Will start azithromycin to address lower respiratory infection. Offered cough suppression medications. Supportive care recommended otherwise. Counseled patient on potential for adverse effects with medications prescribed today, patient verbalized  understanding. Return-to-clinic precautions discussed, patient verbalized understanding.   Wallis BambergMario Brealyn Baril, PA-C Primary Care at [facility] [phone] 10/14/2017  3:16 PM

## 2017-10-14 NOTE — Patient Instructions (Addendum)
Para el dolor de garganta intente usar un t de miel. Use 3 cucharaditas de miel con jugo exprimido de CBS Corporationmedio limn. Coloque las piezas de Bulgariajengibre afeitadas en 1/2 - 1 taza de agua y caliente sobre la estufa. Luego mezcle los ingredientes y repita cada 4 horas.      Tos en los adultos Cough, Adult La tos es un reflejo que despeja la garganta y las vas respiratorias. Toser ayuda a curar y Jabil Circuitproteger los pulmones. Es normal toser a Occupational psychologistveces, pero si la tos aparece junto con otros sntomas o si dura Con-waymucho tiempo, puede indicar una enfermedad que necesita Frontenactratamiento. La tos puede durar solo 2 o 3semanas (aguda) o ms de 8semanas (crnica). Cules son las causas? Por lo general, las causas de la tos son las siguientes:  Aspirar sustancias que Sealed Air Corporationirritan los pulmones.  Una infeccin respiratoria de origen viral o bacteriano.  Alergias.  Asma.  Goteo posnasal.  Fumar.  cido que vuelve del estmago hacia el esfago (reflujo gastroesofgico ).  Ciertos medicamentos.  Enfermedades pulmonares crnicas, incluida la EPOC (o rara vez, cncer de pulmn).  Otras afecciones, por ejemplo, insuficiencia cardaca.  Siga estas indicaciones en su casa: Est atento a cualquier cambio en los sntomas. Tome estas medidas para Paramedicaliviar las molestias:  Tome los medicamentos solamente como se lo haya indicado el mdico. ? Si le recetaron un antibitico, tmelo como se lo haya indicado el mdico. No deje de tomar los antibiticos aunque comience a Actorsentirse mejor. ? Hable con el mdico antes de tomar un medicamento para la tos (antitusivo).  Beba suficiente lquido para Photographermantener la orina clara o de color amarillo plido.  Si el aire est seco, use un vaporizador o humidificador de niebla fra en la habitacin o en la casa para ayudar a aflojar las secreciones.  Evite todo lo que le provoque tos en el trabajo o en su casa.  Si la tos empeora por la noche, pruebe a dormir en posicin semierguida.  Evite  el humo del cigarrillo. Si fuma, deje de hacerlo. Si necesita ayuda para dejar de fumar, consulte al mdico.  Evite la cafena.  Evite el alcohol.  Descanse todo lo que sea necesario.  Comunquese con un mdico si:  Aparecen nuevos sntomas.  Tose y escupe pus.  La tos no mejora despus de 2 o 3semanas o empeora.  No puede controlar la tos con antitusivos y no puede dormir debido a Secretary/administratorello.  Siente un dolor que empeora o que no puede controlar con medicamentos.  Tiene fiebre.  Baja de peso sin causa aparente.  Tiene transpiracin nocturna. Solicite ayuda de inmediato si:  Tose y Commercial Metals Companyescupe sangre.  Tiene dificultad para respirar.  El Hershey Companycorazn le late [place]muy rpido. Esta informacin no tiene Theme park managercomo fin reemplazar el consejo del mdico. Asegrese de hacerle al mdico cualquier pregunta que tenga. Document Released: 02/23/2011 Document Revised: 10/20/2016 Document Reviewed: 09/24/2014 Elsevier Interactive Patient Education  2018 [employer]     IF you received an x-ray today, you will receive an invoice from [facility]. Please contact [facility] at [phone] with questions or concerns regarding your invoice.   IF you received labwork today, you will receive an invoice from [facility]. Please contact [facility] at [phone] with questions or concerns regarding your invoice.   Our billing staff will not be able to assist you with questions regarding bills from these companies.  You will be contacted with the lab results as soon as they are available. The fastest way to get  your results is to activate your My Chart account. Instructions are located on the last page of this paperwork. If you have not heard from [place] regarding the results in 2 weeks, please contact this office.

## 2017-11-01 DIAGNOSIS — M19041 Primary osteoarthritis, right hand: Secondary | ICD-10-CM | POA: Insufficient documentation

## 2017-11-01 DIAGNOSIS — M19042 Primary osteoarthritis, left hand: Secondary | ICD-10-CM

## 2018-09-01 ENCOUNTER — Encounter: Payer: Self-pay | Admitting: Emergency Medicine

## 2018-09-01 ENCOUNTER — Ambulatory Visit (INDEPENDENT_AMBULATORY_CARE_PROVIDER_SITE_OTHER): Payer: Self-pay | Admitting: Emergency Medicine

## 2018-09-01 ENCOUNTER — Other Ambulatory Visit: Payer: Self-pay

## 2018-09-01 VITALS — BP 166/90 | HR 88 | Temp 98.2°F | Ht 65.0 in | Wt 168.2 lb

## 2018-09-01 DIAGNOSIS — I10 Essential (primary) hypertension: Secondary | ICD-10-CM

## 2018-09-01 DIAGNOSIS — Z23 Encounter for immunization: Secondary | ICD-10-CM

## 2018-09-01 MED ORDER — TRIAMTERENE-HCTZ 37.5-25 MG PO CAPS: 1.0000 | ORAL_CAPSULE | Freq: Every day | ORAL | 3 refills | Status: DC

## 2018-09-01 MED ORDER — AMLODIPINE BESYLATE 5 MG PO TABS: 5.0000 mg | ORAL_TABLET | Freq: Every day | ORAL | 3 refills | Status: DC

## 2018-09-01 NOTE — Patient Instructions (Addendum)
   If you have lab work done today you will be contacted with your lab results within the next 2 weeks.  If you have not heard from us then please contact us. The fastest way to get your results is to register for My Chart.   IF you received an x-ray today, you will receive an invoice from [facility]. Please contact [facility] at [phone] with questions or concerns regarding your invoice.   IF you received labwork today, you will receive an invoice from [facility]. Please contact [facility] at [phone] with questions or concerns regarding your invoice.   Our billing staff will not be able to assist you with questions regarding bills from these companies.  You will be contacted with the lab results as soon as they are available. The fastest way to get your results is to activate your My Chart account. Instructions are located on the last page of this paperwork. If you have not heard from us regarding the results in 2 weeks, please contact this office.       Hypertension Hypertension, commonly called high blood pressure, is when the force of blood pumping through the arteries is too strong. The arteries are the blood vessels that carry blood from the heart throughout the body. Hypertension forces the heart to work harder to pump blood and may cause arteries to become narrow or stiff. Having untreated or uncontrolled hypertension can cause heart attacks, strokes, kidney disease, and other problems. A blood pressure reading consists of a higher number over a lower number. Ideally, your blood pressure should be below 120/80. The first ("top") number is called the systolic pressure. It is a measure of the pressure in your arteries as your heart beats. The second ("bottom") number is called the diastolic pressure. It is a measure of the pressure in your arteries as the heart relaxes. What are the causes? The cause of this condition is not known. What increases the  risk? Some risk factors for high blood pressure are under your control. Others are not. Factors you can change  Smoking.  Having type 2 diabetes mellitus, high cholesterol, or both.  Not getting enough exercise or physical activity.  Being overweight.  Having too much fat, sugar, calories, or salt (sodium) in your diet.  Drinking too much alcohol. Factors that are difficult or impossible to change  Having chronic kidney disease.  Having a family history of high blood pressure.  Age. Risk increases with age.  Race. You may be at higher risk if you are African-American.  Gender. Men are at higher risk than women before age 45. After age 65, women are at higher risk than men.  Having obstructive sleep apnea.  Stress. What are the signs or symptoms? Extremely high blood pressure (hypertensive crisis) may cause:  Headache.  Anxiety.  Shortness of breath.  Nosebleed.  Nausea and vomiting.  Severe chest pain.  Jerky movements you cannot control (seizures). How is this diagnosed? This condition is diagnosed by measuring your blood pressure while you are seated, with your arm resting on a surface. The cuff of the blood pressure monitor will be placed directly against the skin of your upper arm at the level of your heart. It should be measured at least twice using the same arm. Certain conditions can cause a difference in blood pressure between your right and left arms. Certain factors can cause blood pressure readings to be lower or higher than normal (elevated) for a short period of time:    When your blood pressure is higher when you are in a health care provider's office than when you are at home, this is called white coat hypertension. Most people with this condition do not need medicines.  When your blood pressure is higher at home than when you are in a health care provider's office, this is called masked hypertension. Most people with this condition may need medicines  to control blood pressure. If you have a high blood pressure reading during one visit or you have normal blood pressure with other risk factors:  You may be asked to return on a different day to have your blood pressure checked again.  You may be asked to monitor your blood pressure at home for 1 week or longer. If you are diagnosed with hypertension, you may have other blood or imaging tests to help your health care provider understand your overall risk for other conditions. How is this treated? This condition is treated by making healthy lifestyle changes, such as eating healthy foods, exercising more, and reducing your alcohol intake. Your health care provider may prescribe medicine if lifestyle changes are not enough to get your blood pressure under control, and if:  Your systolic blood pressure is above 130.  Your diastolic blood pressure is above 80. Your personal target blood pressure may vary depending on your medical conditions, your age, and other factors. Follow these instructions at home: Eating and drinking   Eat a diet that is high in fiber and potassium, and low in sodium, added sugar, and fat. An example eating plan is called the DASH (Dietary Approaches to Stop Hypertension) diet. To eat this way: ? Eat plenty of fresh fruits and vegetables. Try to fill half of your plate at each meal with fruits and vegetables. ? Eat whole grains, such as whole wheat pasta, brown rice, or whole grain bread. Fill about one quarter of your plate with whole grains. ? Eat or drink low-fat dairy products, such as skim milk or low-fat yogurt. ? Avoid fatty cuts of meat, processed or cured meats, and poultry with skin. Fill about one quarter of your plate with lean proteins, such as fish, chicken without skin, beans, eggs, and tofu. ? Avoid premade and processed foods. These tend to be higher in sodium, added sugar, and fat.  Reduce your daily sodium intake. Most people with hypertension should  eat less than 1,500 mg of sodium a day.  Limit alcohol intake to no more than 1 drink a day for nonpregnant women and 2 drinks a day for men. One drink equals 12 oz of beer, 5 oz of wine, or 1 oz of hard liquor. Lifestyle   Work with your health care provider to maintain a healthy body weight or to lose weight. Ask what an ideal weight is for you.  Get at least 30 minutes of exercise that causes your heart to beat faster (aerobic exercise) most days of the week. Activities may include walking, swimming, or biking.  Include exercise to strengthen your muscles (resistance exercise), such as pilates or lifting weights, as part of your weekly exercise routine. Try to do these types of exercises for 30 minutes at least 3 days a week.  Do not use any products that contain nicotine or tobacco, such as cigarettes and e-cigarettes. If you need help quitting, ask your health care provider.  Monitor your blood pressure at home as told by your health care provider.  Keep all follow-up visits as told by your health care provider.   This is important. Medicines  Take over-the-counter and prescription medicines only as told by your health care provider. Follow directions carefully. Blood pressure medicines must be taken as prescribed.  Do not skip doses of blood pressure medicine. Doing this puts you at risk for problems and can make the medicine less effective.  Ask your health care provider about side effects or reactions to medicines that you should watch for. Contact a health care provider if:  You think you are having a reaction to a medicine you are taking.  You have headaches that keep coming back (recurring).  You feel dizzy.  You have swelling in your ankles.  You have trouble with your vision. Get help right away if:  You develop a severe headache or confusion.  You have unusual weakness or numbness.  You feel faint.  You have severe pain in your chest or abdomen.  You vomit  repeatedly.  You have trouble breathing. Summary  Hypertension is when the force of blood pumping through your arteries is too strong. If this condition is not controlled, it may put you at risk for serious complications.  Your personal target blood pressure may vary depending on your medical conditions, your age, and other factors. For most people, a normal blood pressure is less than 120/80.  Hypertension is treated with lifestyle changes, medicines, or a combination of both. Lifestyle changes include weight loss, eating a healthy, low-sodium diet, exercising more, and limiting alcohol. This information is not intended to replace advice given to you by your health care provider. Make sure you discuss any questions you have with your health care provider. Document Released: 07/18/2005 Document Revised: 06/15/2016 Document Reviewed: 06/15/2016 Elsevier Interactive Patient Education  2019 Elsevier Inc.  

## 2018-09-01 NOTE — Progress Notes (Signed)
[name] [name] 53 y.o.   Chief Complaint  Patient presents with  . Medication Refill    norvasc and triamterene-hctz. Has not taken meds in two days. Asking for 90 days supply due to having no insurance    HISTORY OF PRESENT ILLNESS: This is a 53 y.o. female with history of hypertension on amlodipine and Dyazide.  Needs medication refill.  Did not take medication for the past 2 days.  Asymptomatic.  No complaints or medical concerns today.Marland Kitchen.  HPI   Prior to Admission medications   Medication Sig Start Date End Date Taking? Authorizing Provider  amLODipine (NORVASC) 2.5 MG tablet Take 1 tablet (2.5 mg total) by mouth daily. 10/20/13  Yes [name] [name], Kurt, MD  triamterene-hydrochlorothiazide (DYAZIDE) 37.5-25 MG capsule Take by mouth. 08/30/17  Yes [provider]    Allergies  Allergen Reactions  . Doxycycline     Patient Active Problem List   Diagnosis Date Noted  . Osteoarthritis of both hands 11/01/2017  . Colon cancer screening 08/29/2016  . Iron deficiency anemia 08/29/2016  . Need for influenza vaccination 08/29/2016  . Vitamin D deficiency 08/29/2016  . Hypertension 10/09/2011  . Anxiety 10/09/2011    Past Medical History:  Diagnosis Date  . Anxiety   . Hypertension     History reviewed. No pertinent surgical history.  Social History   Socioeconomic History  . Marital status: Married    Spouse name: Not on file  . Number of children: Not on file  . Years of education: Not on file  . Highest education level: Not on file  Occupational History  . Not on file  Social Needs  . Financial resource strain: Not on file  . Food insecurity:    Worry: Not on file    Inability: Not on file  . Transportation needs:    Medical: Not on file    Non-medical: Not on file  Tobacco Use  . Smoking status: Never Smoker  . Smokeless tobacco: Never Used  Substance and Sexual Activity  . Alcohol use: No  . Drug use: No  . Sexual activity: Never   Birth control/protection: Abstinence  Lifestyle  . Physical activity:    Days per week: Not on file    Minutes per session: Not on file  . Stress: Not on file  Relationships  . Social connections:    Talks on phone: Not on file    Gets together: Not on file    Attends religious service: Not on file    Active member of club or organization: Not on file    Attends meetings of clubs or organizations: Not on file    Relationship status: Not on file  . Intimate partner violence:    Fear of current or ex partner: Not on file    Emotionally abused: Not on file    Physically abused: Not on file    Forced sexual activity: Not on file  Other Topics Concern  . Not on file  Social History Narrative  . Not on file    Family History  Problem Relation Age of Onset  . Hypertension Mother      Review of Systems  Constitutional: Negative.  Negative for fever.  HENT: Negative.  Negative for nosebleeds.   Eyes: Negative.  Negative for blurred vision and double vision.  Respiratory: Negative.  Negative for cough and shortness of breath.   Cardiovascular: Negative.  Negative for chest pain and palpitations.  Gastrointestinal: Negative for abdominal pain, nausea and vomiting.  Genitourinary: Negative.   Musculoskeletal: Negative.   Skin: Negative.   Neurological: Negative.  Negative for dizziness.  Endo/Heme/Allergies: Negative.   All other systems reviewed and are negative.  Vitals:   09/01/18 1041  BP: (!) 166/90  Pulse: 88  Temp: 98.2 F (36.8 C)  SpO2: 98%     Physical Exam Vitals signs reviewed.  Constitutional:      Appearance: Normal appearance.  HENT:     Head: Normocephalic.     Mouth/Throat:     Mouth: Mucous membranes are moist.     Pharynx: Oropharynx is clear.  Eyes:     Extraocular Movements: Extraocular movements intact.     Conjunctiva/sclera: Conjunctivae normal.     Pupils: Pupils are equal, round, and reactive to light.  Cardiovascular:     Rate and  Rhythm: Normal rate and regular rhythm.     Heart sounds: Murmur present. Systolic murmur present with a grade of 3/6.  Pulmonary:     Effort: Pulmonary effort is normal.     Breath sounds: Normal breath sounds.  Musculoskeletal: Normal range of motion.  Skin:    General: Skin is warm.     Capillary Refill: Capillary refill takes less than 2 seconds.  Neurological:     General: No focal deficit present.     Mental Status: She is alert and oriented to person, place, and time.  Psychiatric:        Mood and Affect: Mood normal.        Behavior: Behavior normal.      ASSESSMENT & PLAN: [name] was seen today for medication refill.  Diagnoses and all orders for this visit:  Essential hypertension -     triamterene-hydrochlorothiazide (DYAZIDE) 37.5-25 MG capsule; Take 1 each (1 capsule total) by mouth daily. -     amLODipine (NORVASC) 5 MG tablet; Take 1 tablet (5 mg total) by mouth daily.  Need for prophylactic vaccination and inoculation against influenza -     Flu Vaccine QUAD 36+ mos IM     Patient Instructions       If you have lab work done today you will be contacted with your lab results within the next 2 weeks.  If you have not heard from [place] then please contact us. The fastest way to get your results is to register for My Chart.   IF you received an x-ray today, you will receive an invoice from [facility]. Please contact [facility] at [phone] with questions or concerns regarding your invoice.   IF you received labwork today, you will receive an invoice from [place]. Please contact [facility] at [phone] with questions or concerns regarding your invoice.   Our billing staff will not be able to assist you with questions regarding bills from these companies.  You will be contacted with the lab results as soon as they are available. The fastest way to get your results is to activate your My Chart account. Instructions are located on the  last page of this paperwork. If you have not heard from [place] regarding the results in 2 weeks, please contact this office.     Hypertension Hypertension, commonly called high blood pressure, is when the force of blood pumping through the arteries is too strong. The arteries are the blood vessels that carry blood from the heart throughout the body. Hypertension forces the heart to work harder to pump blood and may cause arteries to become narrow or stiff. Having untreated or uncontrolled hypertension can cause heart  attacks, strokes, kidney disease, and other problems. A blood pressure reading consists of a higher number over a lower number. Ideally, your blood pressure should be below 120/80. The first ("top") number is called the systolic pressure. It is a measure of the pressure in your arteries as your heart beats. The second ("bottom") number is called the diastolic pressure. It is a measure of the pressure in your arteries as the heart relaxes. What are the causes? The cause of this condition is not known. What increases the risk? Some risk factors for high blood pressure are under your control. Others are not. Factors you can change  Smoking.  Having type 2 diabetes mellitus, high cholesterol, or both.  Not getting enough exercise or physical activity.  Being overweight.  Having too much fat, sugar, calories, or salt (sodium) in your diet.  Drinking too much alcohol. Factors that are difficult or impossible to change  Having chronic kidney disease.  Having a family history of high blood pressure.  Age. Risk increases with age.  Race. You may be at higher risk if you are African-American.  Gender. Men are at higher risk than women before age 38. After age 89, women are at higher risk than men.  Having obstructive sleep apnea.  Stress. What are the signs or symptoms? Extremely high blood pressure (hypertensive crisis) may cause:  Headache.  Anxiety.  Shortness of  breath.  Nosebleed.  Nausea and vomiting.  Severe chest pain.  Jerky movements you cannot control (seizures). How is this diagnosed? This condition is diagnosed by measuring your blood pressure while you are seated, with your arm resting on a surface. The cuff of the blood pressure monitor will be placed directly against the skin of your upper arm at the level of your heart. It should be measured at least twice using the same arm. Certain conditions can cause a difference in blood pressure between your right and left arms. Certain factors can cause blood pressure readings to be lower or higher than normal (elevated) for a short period of time:  When your blood pressure is higher when you are in a health care provider's office than when you are at home, this is called white coat hypertension. Most people with this condition do not need medicines.  When your blood pressure is higher at home than when you are in a health care provider's office, this is called masked hypertension. Most people with this condition may need medicines to control blood pressure. If you have a high blood pressure reading during one visit or you have normal blood pressure with other risk factors:  You may be asked to return on a different day to have your blood pressure checked again.  You may be asked to monitor your blood pressure at home for 1 week or longer. If you are diagnosed with hypertension, you may have other blood or imaging tests to help your health care provider understand your overall risk for other conditions. How is this treated? This condition is treated by making healthy lifestyle changes, such as eating healthy foods, exercising more, and reducing your alcohol intake. Your health care provider may prescribe medicine if lifestyle changes are not enough to get your blood pressure under control, and if:  Your systolic blood pressure is above 130.  Your diastolic blood pressure is above 80. Your  personal target blood pressure may vary depending on your medical conditions, your age, and other factors. Follow these instructions at home: Eating and drinking  Eat a diet that is high in fiber and potassium, and low in sodium, added sugar, and fat. An example eating plan is called the DASH (Dietary Approaches to Stop Hypertension) diet. To eat this way: ? Eat plenty of fresh fruits and vegetables. Try to fill half of your plate at each meal with fruits and vegetables. ? Eat whole grains, such as whole wheat pasta, brown rice, or whole grain bread. Fill about one quarter of your plate with whole grains. ? Eat or drink low-fat dairy products, such as skim milk or low-fat yogurt. ? Avoid fatty cuts of meat, processed or cured meats, and poultry with skin. Fill about one quarter of your plate with lean proteins, such as fish, chicken without skin, beans, eggs, and tofu. ? Avoid premade and processed foods. These tend to be higher in sodium, added sugar, and fat.  Reduce your daily sodium intake. Most people with hypertension should eat less than 1,500 mg of sodium a day.  Limit alcohol intake to no more than 1 drink a day for nonpregnant women and 2 drinks a day for men. One drink equals 12 oz of beer, 5 oz of wine, or 1 oz of hard liquor. Lifestyle   Work with your health care provider to maintain a healthy body weight or to lose weight. Ask what an ideal weight is for you.  Get at least 30 minutes of exercise that causes your heart to beat faster (aerobic exercise) most days of the week. Activities may include walking, swimming, or biking.  Include exercise to strengthen your muscles (resistance exercise), such as pilates or lifting weights, as part of your weekly exercise routine. Try to do these types of exercises for 30 minutes at least 3 days a week.  Do not use any products that contain nicotine or tobacco, such as cigarettes and e-cigarettes. If you need help quitting, ask your  health care provider.  Monitor your blood pressure at home as told by your health care provider.  Keep all follow-up visits as told by your health care provider. This is important. Medicines  Take over-the-counter and prescription medicines only as told by your health care provider. Follow directions carefully. Blood pressure medicines must be taken as prescribed.  Do not skip doses of blood pressure medicine. Doing this puts you at risk for problems and can make the medicine less effective.  Ask your health care provider about side effects or reactions to medicines that you should watch for. Contact a health care provider if:  You think you are having a reaction to a medicine you are taking.  You have headaches that keep coming back (recurring).  You feel dizzy.  You have swelling in your ankles.  You have trouble with your vision. Get help right away if:  You develop a severe headache or confusion.  You have unusual weakness or numbness.  You feel faint.  You have severe pain in your chest or abdomen.  You vomit repeatedly.  You have trouble breathing. Summary  Hypertension is when the force of blood pumping through your arteries is too strong. If this condition is not controlled, it may put you at risk for serious complications.  Your personal target blood pressure may vary depending on your medical conditions, your age, and other factors. For most people, a normal blood pressure is less than 120/80.  Hypertension is treated with lifestyle changes, medicines, or a combination of both. Lifestyle changes include weight loss, eating a healthy, low-sodium diet, exercising more,  and limiting alcohol. This information is not intended to replace advice given to you by your health care provider. Make sure you discuss any questions you have with your health care provider. Document Released: 07/18/2005 Document Revised: 06/15/2016 Document Reviewed: 06/15/2016 [employer]  Interactive Patient Education  2019 [employer]      Edwina BarthMiguel Cannon Quinton, MD [facility]

## 2019-03-05 ENCOUNTER — Ambulatory Visit: Payer: Self-pay | Admitting: Emergency Medicine

## 2019-03-06 ENCOUNTER — Encounter: Payer: Self-pay | Admitting: Emergency Medicine

## 2019-05-06 ENCOUNTER — Ambulatory Visit (INDEPENDENT_AMBULATORY_CARE_PROVIDER_SITE_OTHER): Payer: Self-pay | Admitting: Emergency Medicine

## 2019-05-06 ENCOUNTER — Other Ambulatory Visit: Payer: Self-pay

## 2019-05-06 ENCOUNTER — Encounter: Payer: Self-pay | Admitting: Emergency Medicine

## 2019-05-06 VITALS — BP 131/89 | HR 68 | Temp 98.0°F | Resp 16 | Ht 65.0 in | Wt 167.2 lb

## 2019-05-06 DIAGNOSIS — G459 Transient cerebral ischemic attack, unspecified: Secondary | ICD-10-CM

## 2019-05-06 DIAGNOSIS — I10 Essential (primary) hypertension: Secondary | ICD-10-CM

## 2019-05-06 DIAGNOSIS — I779 Disorder of arteries and arterioles, unspecified: Secondary | ICD-10-CM

## 2019-05-06 DIAGNOSIS — R0989 Other specified symptoms and signs involving the circulatory and respiratory systems: Secondary | ICD-10-CM

## 2019-05-06 DIAGNOSIS — Z23 Encounter for immunization: Secondary | ICD-10-CM

## 2019-05-06 NOTE — Progress Notes (Signed)
[name] [name] 53 y.o.   Chief Complaint  Patient presents with  . Hypertension    per patient concerned about high blood pressure    HISTORY OF PRESENT ILLNESS: This is a 53 y.o. female with history of hypertension here for follow-up.  Presently taking amlodipine 5 mg daily and Dyazide daily. BP Readings from Last 3 Encounters:  05/06/19 131/89  09/01/18 (!) 166/90  10/14/17 120/90  Concerned that her blood pressure has been high the past several days with transient visual problem episodes. Denies syncopal episode, chest pain, difficulty breathing, nausea or vomiting.  HPI   Prior to Admission medications   Medication Sig Start Date End Date Taking? Authorizing Provider  amLODipine (NORVASC) 5 MG tablet Take 1 tablet (5 mg total) by mouth daily. 09/01/18  Yes [name] [name], [name] Kempf, MD  triamterene-hydrochlorothiazide (DYAZIDE) 37.5-25 MG capsule Take 1 each (1 capsule total) by mouth daily. 09/01/18 11/30/18  [name] Quint, MD    Allergies  Allergen Reactions  . Doxycycline     Patient Active Problem List   Diagnosis Date Noted  . Osteoarthritis of both hands 11/01/2017  . Iron deficiency anemia 08/29/2016  . Hypertension 10/09/2011    Past Medical History:  Diagnosis Date  . Anxiety   . Hypertension     History reviewed. No pertinent surgical history.  Social History   Socioeconomic History  . Marital status: Married    Spouse name: Not on file  . Number of children: Not on file  . Years of education: Not on file  . Highest education level: Not on file  Occupational History  . Not on file  Social Needs  . Financial resource strain: Not on file  . Food insecurity    Worry: Not on file    Inability: Not on file  . Transportation needs    Medical: Not on file    Non-medical: Not on file  Tobacco Use  . Smoking status: Never Smoker  . Smokeless tobacco: Never Used  Substance and Sexual Activity  . Alcohol use: No  . Drug use: No  .  Sexual activity: Never    Birth control/protection: Abstinence  Lifestyle  . Physical activity    Days per week: Not on file    Minutes per session: Not on file  . Stress: Not on file  Relationships  . Social Musician on phone: Not on file    Gets together: Not on file    Attends religious service: Not on file    Active member of club or organization: Not on file    Attends meetings of clubs or organizations: Not on file    Relationship status: Not on file  . Intimate partner violence    Fear of current or ex partner: Not on file    Emotionally abused: Not on file    Physically abused: Not on file    Forced sexual activity: Not on file  Other Topics Concern  . Not on file  Social History Narrative  . Not on file    Family History  Problem Relation Age of Onset  . Hypertension Mother      Review of Systems  Constitutional: Negative.  Negative for chills and fever.  HENT: Negative for congestion and sore throat.   Eyes: Positive for blurred vision. Negative for pain.  Respiratory: Negative.  Negative for cough and shortness of breath.   Cardiovascular: Negative.  Negative for chest pain and palpitations.  Gastrointestinal: Negative for abdominal  pain, diarrhea, nausea and vomiting.  Skin: Negative.  Negative for rash.  Neurological: Negative.  Negative for dizziness, sensory change, speech change, focal weakness, seizures, loss of consciousness and headaches.  Endo/Heme/Allergies: Negative.   All other systems reviewed and are negative.   Vitals:   05/06/19 1542  BP: 131/89  Pulse: 68  Resp: 16  Temp: 98 F (36.7 C)  SpO2: 97%    Physical Exam Vitals signs reviewed.  Constitutional:      Appearance: Normal appearance.  HENT:     Head: Normocephalic.  Eyes:     Extraocular Movements: Extraocular movements intact.     Conjunctiva/sclera: Conjunctivae normal.     Pupils: Pupils are equal, round, and reactive to light.  Neck:     Vascular:  Carotid bruit (Loud right-sided) present.  Cardiovascular:     Rate and Rhythm: Normal rate and regular rhythm.     Heart sounds: Normal heart sounds.  Pulmonary:     Effort: Pulmonary effort is normal.     Breath sounds: Normal breath sounds.  Musculoskeletal: Normal range of motion.  Skin:    General: Skin is warm and dry.  Neurological:     General: No focal deficit present.     Mental Status: She is alert and oriented to person, place, and time.     Cranial Nerves: No cranial nerve deficit.     Sensory: No sensory deficit.     Motor: No weakness.     Gait: Gait normal.  Psychiatric:        Mood and Affect: Mood normal.        Behavior: Behavior normal.      ASSESSMENT & PLAN: [name] [name] was seen today for hypertension.  Diagnoses and all orders for this visit:  TIA (transient ischemic attack)  Bruit of right carotid artery  Right-sided carotid artery disease, unspecified type (HCC)  Essential hypertension  Need for prophylactic vaccination and inoculation against influenza -     Flu Vaccine QUAD 36+ mos IM    Patient Instructions    Go to the emergency room now for further evaluation and treatment.   If you have lab work done today you will be contacted with your lab results within the next 2 weeks.  If you have not heard from [place] then please contact us. The fastest way to get your results is to register for My Chart.   IF you received an x-ray today, you will receive an invoice from [facility]. Please contact [facility] at [phone] with questions or concerns regarding your invoice.   IF you received labwork today, you will receive an invoice from [place]. Please contact [facility] at [phone] with questions or concerns regarding your invoice.   Our billing staff will not be able to assist you with questions regarding bills from these companies.  You will be contacted with the lab results as soon as they are available. The fastest  way to get your results is to activate your My Chart account. Instructions are located on the last page of this paperwork. If you have not heard from [place] regarding the results in 2 weeks, please contact this office.     Transient Ischemic Attack  A transient ischemic attack (TIA) is a "warning stroke" that causes stroke-like symptoms that go away quickly. A TIA does not cause lasting damage to the brain. But having a TIA is a sign that you may be at risk for a stroke. Lifestyle changes and medical treatments can help  prevent a stroke. It is important to know the symptoms of a TIA and what to do. Get help right away, even if your symptoms go away. The symptoms of a TIA are the same as those of a stroke. They can happen fast, and they usually go away within minutes or hours. They can include:  Weakness or loss of feeling in your face, arm, or leg. This often happens on one side of your body.  Trouble walking.  Trouble moving your arms or legs.  Trouble talking or understanding what people are saying.  Trouble seeing.  Seeing two of one object (double vision).  Feeling dizzy.  Feeling confused.  Loss of balance or coordination.  Feeling sick to your stomach (nauseous) and throwing up (vomiting).  A very bad headache for no reason. What increases the risk? Certain things may make you more likely to have a TIA. Some of these are things that you can change, such as:  Being very overweight (obese).  Using products that contain nicotine or tobacco, such as cigarettes and e-cigarettes.  Taking birth control pills.  Not being active.  Drinking too much alcohol.  Using drugs. Other risk factors include:  Having an irregular heartbeat (atrial fibrillation).  Being African American or Hispanic.  Having had blood clots, stroke, TIA, or heart attack in the past.  Being a woman with a history of high blood pressure in pregnancy (preeclampsia).  Being over the age of 560.  Being  female.  Having family history of stroke.  Having the following diseases or conditions: ? High blood pressure. ? High cholesterol. ? Diabetes. ? Heart disease. ? Sickle cell disease. ? Sleep apnea. ? Migraine headache. ? Long-term (chronic) diseases that cause soreness and swelling (inflammation). ? Disorders that affect how your blood clots. Follow these instructions at home: Medicines   Take over-the-counter and prescription medicines only as told by your doctor.  If you were told to take aspirin or another medicine to thin your blood, take it exactly as told by your doctor. ? Taking too much of the medicine can cause bleeding. ? Taking too little of the medicine may not work to treat the problem. Eating and drinking   Eat 5 or more servings of fruits and vegetables each day.  Follow instructions from your doctor about your diet. You may need to follow a certain diet to help lower your risk of having a stroke. You may need to: ? Eat a diet that is low in fat and salt. ? Eat foods that contain a lot of fiber. ? Limit the amount of carbohydrates and sugar in your diet.  Limit alcohol intake to 1 drink a day for nonpregnant women and 2 drinks a day for men. One drink equals 12 oz of beer, 5 oz of wine, or 1 oz of hard liquor. General instructions  Keep a healthy weight.  Stay active. Try to get at least 30 minutes of activity on all or most days.  Find out if you have a condition called sleep apnea. Get treatment if needed.  Do not use any products that contain nicotine or tobacco, such as cigarettes and e-cigarettes. If you need help quitting, ask your doctor.  Do not abuse drugs.  Keep all follow-up visits as told by your doctor. This is important. Get help right away if:  You have any signs of stroke. "BE FAST" is an easy way to remember the main warning signs: ? B - Balance. Signs are dizziness, sudden trouble  walking, or loss of balance. ? E - Eyes. Signs are  trouble seeing or a sudden change in how you see. ? F - Face. Signs are sudden weakness or loss of feeling of the face, or the face or eyelid drooping on one side. ? A - Arms. Signs are weakness or loss of feeling in an arm. This happens suddenly and usually on one side of the body. ? S - Speech. Signs are sudden trouble speaking, slurred speech, or trouble understanding what people say. ? T - Time. Time to call emergency services. Write down what time symptoms started.  You have other signs of stroke, such as: ? A sudden, very bad headache with no known cause. ? Feeling sick to your stomach (nausea). ? Throwing up (vomiting). ? Jerky movements that you cannot control (seizure). These symptoms may be an emergency. Do not wait to see if the symptoms will go away. Get medical help right away. Call your local emergency services (911 in the [place] Do not drive yourself to the hospital. Summary  A transient ischemic attack (TIA) is a "warning stroke" that causes stroke-like symptoms that go away quickly.  A TIA is a medical emergency. Get help right away, even if your symptoms go away.  A TIA does not cause lasting damage to the brain.  Having a TIA is a sign that you may be at risk for a stroke. Lifestyle changes and medical treatments can help prevent a stroke. This information is not intended to replace advice given to you by your health care provider. Make sure you discuss any questions you have with your health care provider. Document Released: 04/26/2008 Document Revised: 04/13/2018 Document Reviewed: 10/19/2016 Elsevier Patient Education  2020 Elsevier Inc.      Agustina Caroli, MD [facility]

## 2019-05-06 NOTE — Patient Instructions (Addendum)
Go to the emergency room now for further evaluation and treatment.   If you have lab work done today you will be contacted with your lab results within the next 2 weeks.  If you have not heard from [place] then please contact us. The fastest way to get your results is to register for My Chart.   IF you received an x-ray today, you will receive an invoice from [facility]. Please contact [facility] at [phone] with questions or concerns regarding your invoice.   IF you received labwork today, you will receive an invoice from [place]. Please contact [facility] at [phone] with questions or concerns regarding your invoice.   Our billing staff will not be able to assist you with questions regarding bills from these companies.  You will be contacted with the lab results as soon as they are available. The fastest way to get your results is to activate your My Chart account. Instructions are located on the last page of this paperwork. If you have not heard from [place] regarding the results in 2 weeks, please contact this office.     Transient Ischemic Attack  A transient ischemic attack (TIA) is a "warning stroke" that causes stroke-like symptoms that go away quickly. A TIA does not cause lasting damage to the brain. But having a TIA is a sign that you may be at risk for a stroke. Lifestyle changes and medical treatments can help prevent a stroke. It is important to know the symptoms of a TIA and what to do. Get help right away, even if your symptoms go away. The symptoms of a TIA are the same as those of a stroke. They can happen fast, and they usually go away within minutes or hours. They can include:  Weakness or loss of feeling in your face, arm, or leg. This often happens on one side of your body.  Trouble walking.  Trouble moving your arms or legs.  Trouble talking or understanding what people are saying.  Trouble seeing.  Seeing two of one object (double  vision).  Feeling dizzy.  Feeling confused.  Loss of balance or coordination.  Feeling sick to your stomach (nauseous) and throwing up (vomiting).  A very bad headache for no reason. What increases the risk? Certain things may make you more likely to have a TIA. Some of these are things that you can change, such as:  Being very overweight (obese).  Using products that contain nicotine or tobacco, such as cigarettes and e-cigarettes.  Taking birth control pills.  Not being active.  Drinking too much alcohol.  Using drugs. Other risk factors include:  Having an irregular heartbeat (atrial fibrillation).  Being African American or Hispanic.  Having had blood clots, stroke, TIA, or heart attack in the past.  Being a woman with a history of high blood pressure in pregnancy (preeclampsia).  Being over the age of 34.  Being female.  Having family history of stroke.  Having the following diseases or conditions: ? High blood pressure. ? High cholesterol. ? Diabetes. ? Heart disease. ? Sickle cell disease. ? Sleep apnea. ? Migraine headache. ? Long-term (chronic) diseases that cause soreness and swelling (inflammation). ? Disorders that affect how your blood clots. Follow these instructions at home: Medicines   Take over-the-counter and prescription medicines only as told by your doctor.  If you were told to take aspirin or another medicine to thin your blood, take it exactly as told by your doctor. ? Taking too much of  the medicine can cause bleeding. ? Taking too little of the medicine may not work to treat the problem. Eating and drinking   Eat 5 or more servings of fruits and vegetables each day.  Follow instructions from your doctor about your diet. You may need to follow a certain diet to help lower your risk of having a stroke. You may need to: ? Eat a diet that is low in fat and salt. ? Eat foods that contain a lot of fiber. ? Limit the amount of  carbohydrates and sugar in your diet.  Limit alcohol intake to 1 drink a day for nonpregnant women and 2 drinks a day for men. One drink equals 12 oz of beer, 5 oz of wine, or 1 oz of hard liquor. General instructions  Keep a healthy weight.  Stay active. Try to get at least 30 minutes of activity on all or most days.  Find out if you have a condition called sleep apnea. Get treatment if needed.  Do not use any products that contain nicotine or tobacco, such as cigarettes and e-cigarettes. If you need help quitting, ask your doctor.  Do not abuse drugs.  Keep all follow-up visits as told by your doctor. This is important. Get help right away if:  You have any signs of stroke. "BE FAST" is an easy way to remember the main warning signs: ? B - Balance. Signs are dizziness, sudden trouble walking, or loss of balance. ? E - Eyes. Signs are trouble seeing or a sudden change in how you see. ? F - Face. Signs are sudden weakness or loss of feeling of the face, or the face or eyelid drooping on one side. ? A - Arms. Signs are weakness or loss of feeling in an arm. This happens suddenly and usually on one side of the body. ? S - Speech. Signs are sudden trouble speaking, slurred speech, or trouble understanding what people say. ? T - Time. Time to call emergency services. Write down what time symptoms started.  You have other signs of stroke, such as: ? A sudden, very bad headache with no known cause. ? Feeling sick to your stomach (nausea). ? Throwing up (vomiting). ? Jerky movements that you cannot control (seizure). These symptoms may be an emergency. Do not wait to see if the symptoms will go away. Get medical help right away. Call your local emergency services (911 in the [place] Do not drive yourself to the hospital. Summary  A transient ischemic attack (TIA) is a "warning stroke" that causes stroke-like symptoms that go away quickly.  A TIA is a medical emergency. Get help right  away, even if your symptoms go away.  A TIA does not cause lasting damage to the brain.  Having a TIA is a sign that you may be at risk for a stroke. Lifestyle changes and medical treatments can help prevent a stroke. This information is not intended to replace advice given to you by your health care provider. Make sure you discuss any questions you have with your health care provider. Document Released: 04/26/2008 Document Revised: 04/13/2018 Document Reviewed: 10/19/2016 Elsevier Patient Education  2020 [employer].

## 2019-05-07 ENCOUNTER — Emergency Department (HOSPITAL_COMMUNITY)
Admission: EM | Admit: 2019-05-07 | Discharge: 2019-05-07 | Disposition: A | Discharge status: Home / Self Care | Payer: Self-pay | Attending: Emergency Medicine | Admitting: Emergency Medicine

## 2019-05-07 ENCOUNTER — Other Ambulatory Visit: Payer: Self-pay

## 2019-05-07 ENCOUNTER — Encounter (HOSPITAL_COMMUNITY): Payer: Self-pay

## 2019-05-07 ENCOUNTER — Emergency Department (HOSPITAL_COMMUNITY): Payer: Self-pay

## 2019-05-07 DIAGNOSIS — Z79899 Other long term (current) drug therapy: Secondary | ICD-10-CM | POA: Insufficient documentation

## 2019-05-07 DIAGNOSIS — H53123 Transient visual loss, bilateral: Secondary | ICD-10-CM | POA: Insufficient documentation

## 2019-05-07 DIAGNOSIS — H539 Unspecified visual disturbance: Secondary | ICD-10-CM

## 2019-05-07 DIAGNOSIS — I10 Essential (primary) hypertension: Secondary | ICD-10-CM | POA: Insufficient documentation

## 2019-05-07 LAB — DIFFERENTIAL
Abs Immature Granulocytes: 0.01 10*3/uL (ref 0.00–0.07)
Basophils Absolute: 0 10*3/uL (ref 0.0–0.1)
Basophils Relative: 0 %
Eosinophils Absolute: 0.1 10*3/uL (ref 0.0–0.5)
Eosinophils Relative: 1 %
Immature Granulocytes: 0 %
Lymphocytes Relative: 24 %
Lymphs Abs: 1.6 10*3/uL (ref 0.7–4.0)
Monocytes Absolute: 0.5 10*3/uL (ref 0.1–1.0)
Monocytes Relative: 8 %
Neutro Abs: 4.4 10*3/uL (ref 1.7–7.7)
Neutrophils Relative %: 67 %

## 2019-05-07 LAB — COMPREHENSIVE METABOLIC PANEL
ALT: 29 U/L (ref 0–44)
AST: 28 U/L (ref 15–41)
Albumin: 4.6 g/dL (ref 3.5–5.0)
Alkaline Phosphatase: 79 U/L (ref 38–126)
Anion gap: 12 (ref 5–15)
BUN: 12 mg/dL (ref 6–20)
CO2: 28 mmol/L (ref 22–32)
Calcium: 9.6 mg/dL (ref 8.9–10.3)
Chloride: 97 mmol/L — ABNORMAL LOW (ref 98–111)
Creatinine, Ser: 0.51 mg/dL (ref 0.44–1.00)
GFR calc Af Amer: >60 mL/min (ref >60)
GFR calc non Af Amer: >60 mL/min (ref >60)
Glucose, Bld: 87 mg/dL (ref 70–99)
Potassium: 3.2 mmol/L — ABNORMAL LOW (ref 3.5–5.1)
Sodium: 137 mmol/L (ref 135–145)
Total Bilirubin: 0.6 mg/dL (ref 0.3–1.2)
Total Protein: 7.9 g/dL (ref 6.5–8.1)

## 2019-05-07 LAB — CBC
HCT: 44.8 % (ref 36.0–46.0)
Hemoglobin: 14.6 g/dL (ref 12.0–15.0)
MCH: 29.1 pg (ref 26.0–34.0)
MCHC: 32.6 g/dL (ref 30.0–36.0)
MCV: 89.2 fL (ref 80.0–100.0)
Platelets: 280 10*3/uL (ref 150–400)
RBC: 5.02 MIL/uL (ref 3.87–5.11)
RDW: 11.9 % (ref 11.5–15.5)
WBC: 6.5 10*3/uL (ref 4.0–10.5)
nRBC: 0 % (ref 0.0–0.2)

## 2019-05-07 LAB — PROTIME-INR
INR: 1.1 (ref 0.8–1.2)
Prothrombin Time: 13.6 seconds (ref 11.4–15.2)

## 2019-05-07 LAB — I-STAT BETA HCG BLOOD, ED (MC, WL, AP ONLY): I-stat hCG, quantitative: <5 m[IU]/mL (ref <5)

## 2019-05-07 LAB — APTT: aPTT: 37 seconds — ABNORMAL HIGH (ref 24–36)

## 2019-05-07 MED ORDER — LORAZEPAM 2 MG/ML IJ SOLN
1.0000 mg | INTRAMUSCULAR | Status: AC | PRN
  Administered 2019-05-07: 1 mg via INTRAVENOUS
  Filled 2019-05-07: qty 1

## 2019-05-07 MED ORDER — GADOBUTROL 1 MMOL/ML IV SOLN
7.0000 mL | Freq: Once | INTRAVENOUS | Status: AC | PRN
  Administered 2019-05-07: 7 mL via INTRAVENOUS

## 2019-05-07 NOTE — ED Notes (Signed)
Patient is being transported down for MRI at this time and has been pre-medicated.

## 2019-05-07 NOTE — ED Triage Notes (Addendum)
Patient arrived via POV from Urgent care. Patient is AOx4 and ambulatory, from home. Patient was sent from Urgent Care for concerns of TIA. Patient started having symptoms last Sunday which went away, then Thursday and then Saturday symptoms came and dissipated. Each time symptoms occurred, symptoms only lasted maybe a max of 10 minutes. Patient does English however may need interpreter for describing medical lingo. Patient is not currently having any symptoms at this time.

## 2019-05-07 NOTE — Discharge Instructions (Signed)
I recommend scheduling a follow-up appointment with both your primary care doctor as well as with neurology.  Please see further instructions for the number to contact.  You have recurrence of your symptoms, you develop any numbness weakness, further vision changes, difficulty walking or other concerns, recommend returning to the ER.

## 2019-05-07 NOTE — ED Provider Notes (Signed)
[facility] DEPT Provider Note   CSN: [id number] Arrival date & time: 05/07/19  0848     History   Chief Complaint Chief Complaint  Patient presents with  . Headache  . Dizziness    HPI [name] [name] is a 53 y.o. female.  Patient reports having 3 episodes of transient blurry vision.  First episode occurred last week Sunday, then last Thursday and then this past Saturday.  All episodes lasted for approximately 10 minutes, the first 2 episodes occurred in the evening but the episode on Saturday occurred in the afternoon.  Described as generalized sudden onset of blurry vision in both eyes, no curtain coming down, no pain associated with the vision change.  No other associated neurologic complaints such as speech change, numbness, weakness, gait change.  She states after these episodes she noted dull headache that resolved spontaneously.   Completed chart review, reviewed note from urgent care yesterday, concern for possible TIA and recommended going to emergency department for evaluation.   Patient speaks some [place], but I utilized Optometrist for [place].     HPI  Past Medical History:  Diagnosis Date  . Anxiety   . Hypertension     Patient Active Problem List   Diagnosis Date Noted  . Osteoarthritis of both hands 11/01/2017  . Iron deficiency anemia 08/29/2016  . Hypertension 10/09/2011    No past surgical history on file.   OB History   No obstetric history on file.      Home Medications    Prior to Admission medications   Medication Sig Start Date End Date Taking? Authorizing Provider  amLODipine (NORVASC) 5 MG tablet Take 1 tablet (5 mg total) by mouth daily. 09/01/18   Horald Pollen, MD  triamterene-hydrochlorothiazide (DYAZIDE) 37.5-25 MG capsule Take 1 each (1 capsule total) by mouth daily. 09/01/18 11/30/18  Horald Pollen, MD    Family History Family History  Problem Relation Age of Onset  .  Hypertension Mother     Social History Social History   Tobacco Use  . Smoking status: Never Smoker  . Smokeless tobacco: Never Used  Substance Use Topics  . Alcohol use: No  . Drug use: No     Allergies   Doxycycline   Review of Systems Review of Systems  Constitutional: Negative for chills and fever.  HENT: Negative for ear pain and sore throat.   Eyes: Positive for visual disturbance. Negative for pain.  Respiratory: Negative for cough and shortness of breath.   Cardiovascular: Negative for chest pain and palpitations.  Gastrointestinal: Negative for abdominal pain and vomiting.  Genitourinary: Negative for dysuria and hematuria.  Musculoskeletal: Negative for arthralgias and back pain.  Skin: Negative for color change and rash.  Neurological: Positive for headaches. Negative for seizures and syncope.  All other systems reviewed and are negative.    Physical Exam Updated Vital Signs LMP 05/05/2019   Physical Exam Vitals signs and nursing note reviewed.  Constitutional:      General: She is not in acute distress.    Appearance: She is well-developed.  HENT:     Head: Normocephalic and atraumatic.  Eyes:     General: No visual field deficit.    Extraocular Movements: Extraocular movements intact.     Conjunctiva/sclera: Conjunctivae normal.     Pupils: Pupils are equal, round, and reactive to light.  Neck:     Musculoskeletal: Neck supple.  Cardiovascular:     Rate and Rhythm: Normal rate and regular  rhythm.     Heart sounds: No murmur.  Pulmonary:     Effort: Pulmonary effort is normal. No respiratory distress.     Breath sounds: Normal breath sounds.  Abdominal:     Palpations: Abdomen is soft.     Tenderness: There is no abdominal tenderness.  Skin:    General: Skin is warm and dry.     Capillary Refill: Capillary refill takes less than 2 seconds.  Neurological:     Mental Status: She is alert and oriented to person, place, and time.     Cranial  Nerves: No cranial nerve deficit, dysarthria or facial asymmetry.     Sensory: No sensory deficit.     Motor: No weakness.     Coordination: Coordination normal.  Psychiatric:        Mood and Affect: Mood normal.        Behavior: Behavior normal.      ED Treatments / Results  Labs (all labs ordered are listed, but only abnormal results are displayed) Labs Reviewed - No data to display  EKG None  Radiology No results found.  Procedures Procedures (including critical care time)  Medications Ordered in ED Medications - No data to display   Initial Impression / Assessment and Plan / ED Course  I have reviewed the triage vital signs and the nursing notes.  Pertinent labs & imaging results that were available during my care of the patient were reviewed by me and considered in my medical decision making (see chart for details).  Clinical Course as of May 06 1656  Tue May 07, 2019  0940 Completed initial assessment, patient has no focal deficits at this time, will discuss case with neurology on-call, get CT head for now   [RD]  0955 Discussed case with neurology [facility]), recommends MRI imaging to evaluate for possibility of stroke, if negative then can follow-up in neurology clinic as outpatient, if positive will need to be admitted for full stroke work-up   [RD]  1048 Updated patient, awaiting MRI   [RD]  1405 Reviewed results, will update patient and discharge home   [RD]    Clinical Course User Index [RD] [name], MD       53 year old lady presented to ER after she had 3 episodes over the past week or so transient painless bilateral blurry vision.  Patient had no focal neurologic complaints or findings on exam today.  She was well-appearing with normal vital signs.  Discussed case with neurology, recommending MRI.  MRI was negative for stroke or findings suggestive of multiple sclerosis.  Given no ongoing neurologic complaints and negative MRI, believe she is  appropriate for outpatient management and follow-up in neurology clinic.  Discharged home with also recommended recheck with PCP, reviewed precautions.    After the discussed management above, the patient was determined to be safe for discharge.  The patient was in agreement with this plan and all questions regarding their care were answered.  ED return precautions were discussed and the patient will return to the ED with any significant worsening of condition.   Final Clinical Impressions(s) / ED Diagnoses   Final diagnoses:  Transient vision disturbance    ED Discharge Orders    None       [name], MD 05/07/19 1659

## 2019-05-07 NOTE — ED Notes (Signed)
Nurse Secretary made aware of patient needing MRI.

## 2019-05-08 ENCOUNTER — Ambulatory Visit (INDEPENDENT_AMBULATORY_CARE_PROVIDER_SITE_OTHER): Payer: Self-pay | Admitting: Emergency Medicine

## 2019-05-08 ENCOUNTER — Other Ambulatory Visit: Payer: Self-pay

## 2019-05-08 ENCOUNTER — Encounter: Payer: Self-pay | Admitting: Emergency Medicine

## 2019-05-08 VITALS — BP 117/77 | HR 89 | Temp 98.2°F | Resp 16 | Ht 65.0 in | Wt 164.8 lb

## 2019-05-08 DIAGNOSIS — R519 Headache, unspecified: Secondary | ICD-10-CM

## 2019-05-08 DIAGNOSIS — I10 Essential (primary) hypertension: Secondary | ICD-10-CM

## 2019-05-08 DIAGNOSIS — G459 Transient cerebral ischemic attack, unspecified: Secondary | ICD-10-CM

## 2019-05-08 DIAGNOSIS — H539 Unspecified visual disturbance: Secondary | ICD-10-CM | POA: Insufficient documentation

## 2019-05-08 DIAGNOSIS — I358 Other nonrheumatic aortic valve disorders: Secondary | ICD-10-CM

## 2019-05-08 DIAGNOSIS — F418 Other specified anxiety disorders: Secondary | ICD-10-CM

## 2019-05-08 MED ORDER — ALPRAZOLAM 0.5 MG PO TABS: 0.5000 mg | ORAL_TABLET | Freq: Every day | ORAL | 0 refills | Status: DC | PRN

## 2019-05-08 NOTE — Progress Notes (Signed)
BP Readings from Last 3 Encounters:  05/08/19 117/77  05/07/19 116/72  05/06/19 131/89   [name] [name] is a 53 y.o. female.  Patient reports having 3 episodes of transient blurry vision.  First episode occurred last week Sunday, then last Thursday and then this past Saturday.  All episodes lasted for approximately 10 minutes, the first 2 episodes occurred in the evening but the episode on Saturday occurred in the afternoon.  Described as generalized sudden onset of blurry vision in both eyes, no curtain coming down, no pain associated with the vision change.  No other associated neurologic complaints such as speech change, numbness, weakness, gait change.  She states after these episodes she noted dull headache that resolved spontaneously.   53 year old lady presented to ER after she had 3 episodes over the past week or so transient painless bilateral blurry vision.  Patient had no focal neurologic complaints or findings on exam today.  She was well-appearing with normal vital signs.  Discussed case with neurology, recommending MRI.  MRI was negative for stroke or findings suggestive of multiple sclerosis.  Given no ongoing neurologic complaints and negative MRI, believe she is appropriate for outpatient management and follow-up in neurology clinic.  Discharged home with also recommended recheck with PCP, reviewed precautions  [name] [name] 53 y.o.   Chief Complaint  Patient presents with  . Headache    follow up ED visit - per patient feels good today    HISTORY OF PRESENT ILLNESS: This is a 53 y.o. female seen by me on 05/06/2019 with visual problems and headaches.  Suspected TIAs.  Has a history of hypertension.  Physical exam revealed carotid murmur on the right side/heart murmur aortic valve.  Referred to emergency department.  Seen and evaluated yesterday.  Echocardiogram done in 2014 within normal limits with no valvular pathology detected.  MRI/MRA of head and neck within  normal limits.  Here today for follow-up. No more visual/headache episodes.  Blood pressure has been within normal limits.  Taking amlodipine 5 mg and Dyazide daily.  Reports increased stress and anxiety due to pandemic and social unrest. Blood work done in the ER within normal limits. No history of migraine headaches, however patient was having diffuse headaches preceded by visual symptoms.  Possible migraines.  Patient is perimenopausal. Clinical condition today stable with no new symptoms.  Still feeling anxious.  HPI   Prior to Admission medications   Medication Sig Start Date End Date Taking? Authorizing Provider  amLODipine (NORVASC) 5 MG tablet Take 1 tablet (5 mg total) by mouth daily. 09/01/18  Yes [name] [name], [name] Kempf, MD  FOLIC ACID PO Take 1 tablet by mouth daily.   Yes [provider]  ibuprofen (ADVIL) 200 MG tablet Take 400 mg by mouth every 6 (six) hours as needed for fever or moderate pain.   Yes [provider]  VITAMIN D PO Take 1 tablet by mouth daily.   Yes [provider]  ALPRAZolam Prudy Feeler) 0.5 MG tablet Take 0.5 mg by mouth 3 (three) times daily as needed for anxiety.    [provider]  triamterene-hydrochlorothiazide (DYAZIDE) 37.5-25 MG capsule Take 1 each (1 capsule total) by mouth daily. 09/01/18 05/07/19  [name] Quint, MD    Allergies  Allergen Reactions  . Doxycycline     Patient Active Problem List   Diagnosis Date Noted  . Osteoarthritis of both hands 11/01/2017  . Iron deficiency anemia 08/29/2016  . Hypertension 10/09/2011    Past Medical History:  Diagnosis Date  . Anxiety   . Hypertension     No past surgical history on file.  Social History   Socioeconomic History  . Marital status: Married    Spouse name: Not on file  . Number of children: Not on file  . Years of education: Not on file  . Highest education level: Not on file  Occupational History  . Not on file  Social Needs  . Financial  resource strain: Not on file  . Food insecurity    Worry: Not on file    Inability: Not on file  . Transportation needs    Medical: Not on file    Non-medical: Not on file  Tobacco Use  . Smoking status: Never Smoker  . Smokeless tobacco: Never Used  Substance and Sexual Activity  . Alcohol use: No  . Drug use: No  . Sexual activity: Never    Birth control/protection: Abstinence  Lifestyle  . Physical activity    Days per week: Not on file    Minutes per session: Not on file  . Stress: Not on file  Relationships  . Social Musician on phone: Not on file    Gets together: Not on file    Attends religious service: Not on file    Active member of club or organization: Not on file    Attends meetings of clubs or organizations: Not on file    Relationship status: Not on file  . Intimate partner violence    Fear of current or ex partner: Not on file    Emotionally abused: Not on file    Physically abused: Not on file    Forced sexual activity: Not on file  Other Topics Concern  . Not on file  Social History Narrative  . Not on file    Family History  Problem Relation Age of Onset  . Hypertension Mother      Review of Systems  Constitutional: Negative.  Negative for chills and fever.  HENT: Negative for congestion and sore throat.   Eyes: Negative for blurred vision and double vision.  Respiratory: Negative.  Negative for cough and shortness of breath.   Cardiovascular: Negative.  Negative for chest pain, palpitations and leg swelling.  Gastrointestinal: Negative for abdominal pain, diarrhea, nausea and vomiting.  Skin: Negative.  Negative for rash.  Neurological: Negative for dizziness, sensory change, focal weakness, seizures, loss of consciousness and headaches.  Psychiatric/Behavioral: The patient is nervous/anxious.   All other systems reviewed and are negative.   Vitals:   05/08/19 1652  BP: 117/77  Pulse: 89  Resp: 16  Temp: 98.2 F (36.8 C)   SpO2: 98%    Physical Exam Vitals signs reviewed.  Constitutional:      Appearance: Normal appearance.  HENT:     Head: Normocephalic.  Eyes:     Extraocular Movements: Extraocular movements intact.     Conjunctiva/sclera: Conjunctivae normal.     Pupils: Pupils are equal, round, and reactive to light.  Neck:     Musculoskeletal: Normal range of motion and neck supple.     Vascular: Carotid bruit present.  Cardiovascular:     Rate and Rhythm: Normal rate and regular rhythm.     Heart sounds: Murmur (Aortic 4/6) present.  Pulmonary:     Effort: Pulmonary effort is normal.     Breath sounds: Normal breath sounds.  Musculoskeletal: Normal range of motion.  Skin:    General: Skin is warm and  dry.     Capillary Refill: Capillary refill takes less than 2 seconds.  Neurological:     General: No focal deficit present.     Mental Status: She is alert and oriented to person, place, and time.     Gait: Gait normal.  Psychiatric:        Mood and Affect: Mood normal.        Behavior: Behavior normal.      ASSESSMENT & PLAN: [name] was seen today for headache.  Diagnoses and all orders for this visit:  Aortic heart murmur -     Ambulatory referral to Cardiology  Essential hypertension -     Ambulatory referral to Cardiology  Situational anxiety -     ALPRAZolam (XANAX) 0.5 MG tablet; Take 1 tablet (0.5 mg total) by mouth daily as needed for anxiety.  TIA (transient ischemic attack) -     Ambulatory referral to Cardiology  Transient visual disturbance, bilateral  Intermittent headache Comments: Suspected migraine headache    Patient Instructions    Take 1 baby aspirin daily. Continue monitoring blood pressure at home.  Take additional dose of amlodipine as needed. Take Xanax as needed for situational anxiety. Follow-up with cardiology to get echocardiogram done for evaluation of heart murmur. May need follow-up with neurology for possible migraine headache  preceded by visual auras. Hipertensin en los adultos Hypertension, Adult El trmino hipertensin es otra forma de denominar a la presin arterial elevada. La presin arterial elevada fuerza al corazn a trabajar ms para bombear la sangre. Esto puede causar problemas con el paso del Lestertiempo. Una lectura de presin arterial est compuesta por 2 nmeros. Hay un nmero superior (sistlico) sobre un nmero inferior (diastlico). Lo ideal es tener la presin arterial por debajo de 120/80. Las elecciones saludables pueden ayudar a Personal assistantbajar la presin arterial, o tal vez necesite medicamentos para bajarla. Cules son las causas? Se desconoce la causa de esta afeccin. Algunas afecciones pueden estar relacionadas con la presin arterial alta. Qu incrementa el riesgo?  Fumar.  Tener diabetes mellitus tipo 2, colesterol alto, o ambos.  No hacer la cantidad suficiente de actividad fsica o ejercicio.  Tener sobrepeso.  Consumir mucha grasa, azcar, caloras o sal (sodio) en su dieta.  Beber alcohol en exceso.  Tener una enfermedad renal a largo plazo (crnica).  Tener antecedentes familiares de presin arterial alta.  Edad. Los riesgos aumentan con la edad.  Raza. El riesgo es mayor para las Statisticianpersonas afroamericanas.  Sexo. Antes de los 45aos, los hombres corren ms Goodyear Tireriesgo que las mujeres. Despus de los 65aos, las mujeres corren ms Lexmark Internationalriesgo que los hombres.  Tener apnea obstructiva del sueo.  Estrs. Cules son los signos o los sntomas?  Es posible que la presin arterial alta puede no cause sntomas. La presin arterial muy alta (crisis hipertensiva) puede provocar: ? Dolor de Turkmenistancabeza. ? Sensaciones de preocupacin o nerviosismo (ansiedad). ? Falta de aire. ? Hemorragia nasal. ? Sensacin de Journalist, newspapermalestar en el estmago (nuseas). ? Vmitos. ? Cambios en la forma de ver. ? Dolor muy intenso en el pecho. ? Convulsiones. Cmo se trata?  Esta afeccin se trata haciendo cambios  saludables en el estilo de vida, por ejemplo: ? Consumir alimentos saludables. ? Hacer ms ejercicio. ? Beber menos alcohol.  El mdico puede recetarle medicamentos si los cambios en el estilo de vida no son suficientes para Museum/gallery curatorlograr controlar la presin arterial y si: ? El nmero de arriba est por encima de 130. ? El nmero de  abajo est por encima de 80.  Su presin arterial personal ideal puede variar. Siga estas instrucciones en su casa: Comida y bebida   Si se lo dicen, siga el plan de alimentacin de DASH (Dietary Approaches to Stop Hypertension, Maneras de alimentarse para detener la hipertensin). Para seguir este plan: ? Llene la mitad del plato de cada comida con frutas y verduras. ? Llene un cuarto del plato de cada comida con cereales integrales. Los cereales integrales incluyen pasta integral, arroz integral y pan integral. ? Coma y beba productos lcteos con bajo contenido de grasa, como leche descremada o yogur bajo en grasas. ? Llene un cuarto del plato de cada comida con protenas bajas en grasa (magras). Las protenas bajas en grasa incluyen pescado, pollo sin piel, huevos, frijoles y tofu. ? Evite consumir carne grasa, carne curada y procesada, o pollo con piel. ? Evite consumir alimentos prehechos o procesados.  Consuma menos de 1500 mg de sal por da.  No beba alcohol si: ? El mdico le indica que no lo haga. ? Est embarazada, puede estar embarazada o est tratando de quedar embarazada.  Si bebe alcohol: ? Limite la cantidad que bebe a lo siguiente:  De 0 a 1 medida por da para las mujeres.  De 0 a 2 medidas por da para los hombres. ? Est atento a la cantidad de alcohol que hay en las bebidas que toma. En los [place], una medida equivale a una botella de cerveza de 12oz ( ), un vaso de vino de 5oz ( ) o un vaso de una bebida alcohlica de alta graduacin de 1oz (44ml). Estilo de vida   Trabaje con su mdico para mantenerse en un peso  saludable o para perder peso. Pregntele a su mdico cul es el peso recomendable para usted.  Haga al menos de ejercicio la DIRECTV de la [place]. Estos pueden incluir caminar, nadar o andar en bicicleta.  Realice al menos 30 minutos de ejercicio que fortalezca sus msculos (ejercicios de resistencia) al menos 3 das a la [place]. Estos pueden incluir levantar pesas o hacer [place].  No consuma ningn producto que contenga nicotina o tabaco, como cigarrillos, cigarrillos electrnicos y tabaco de Theatre manager. Si necesita ayuda para dejar de fumar, consulte al [employer].  Controle su presin arterial en su casa tal como le indic el mdico.  Concurra a todas las visitas de seguimiento como se lo haya indicado el mdico. Esto es importante. Medicamentos  [employer] de venta libre y los recetados solamente como se lo haya indicado el mdico. Siga cuidadosamente las indicaciones.  No omita las dosis de medicamentos para la presin arterial. Los medicamentos pierden eficacia si omite dosis. El hecho de omitir las dosis tambin [place] el riesgo de otros problemas.  Pregntele a su mdico a qu efectos secundarios o reacciones a los Museum/gallery curator. Comunquese con un mdico si:  Piensa que tiene [place] reaccin a los medicamentos que est tomando.  Tiene dolores de cabeza frecuentes (recurrentes).  Se siente mareado.  Tiene hinchazn en los tobillos.  Tiene problemas de visin. Solicite ayuda inmediatamente si:  Siente un dolor de cabeza muy intenso.  Empieza a sentirse desorientado (confundido).  Se siente dbil o adormecido.  Siente que va a desmayarse.  Tiene un dolor muy intenso en las siguientes zonas: ? Pecho. ? Vientre (abdomen).  Vomita ms de una vez.  Tiene dificultad para respirar. Resumen  El trmino hipertensin es otra forma de denominar a la  presin arterial elevada.  La presin arterial elevada fuerza al corazn a  trabajar ms para bombear la sangre.  Para la [employer], una presin arterial normal es menor que 120/80.  Las decisiones saludables pueden ayudarle a disminuir su presin arterial. Si no puede bajar su presin arterial mediante decisiones saludables, es posible que deba tomar medicamentos. Esta informacin no tiene Theme park manager el consejo del mdico. Asegrese de hacerle al mdico cualquier pregunta que tenga. Document Released: 01/05/2010 Document Revised: 05/03/2018 Document Reviewed: 05/03/2018 Elsevier Patient Education  2020 Elsevier Inc.  Soplo cardaco Heart Murmur Un soplo cardaco es un sonido extra cuya causa es el flujo sanguneo catico a travs de las vlvulas del corazn. El soplo se puede or como un "zumbido" o "silbido" cuando la sangre atraviesa el corazn. Existen dos tipos de soplos cardacos:  Soplo (benigno) funcional. La mayora de las personas con este tipo de soplo no tienen un problema cardaco. Muchos nios tienen soplos cardacos funcionales. Su mdico puede sugerir algunos estudios bsicos para saber si el soplo es un soplo funcional. Si se descubre un soplo cardaco funcional, no hay necesidad de Sears Holdings Corporation, Tax inspector, restringir las actividades ni suspender la prctica de deportes.  Soplo cardaco anormal. Este tipo de soplo cardaco puede aparecer en nios y adultos. Los soplos anormales pueden ser un signo de una enfermedad cardaca ms grave, como una anomala cardaca presente al nacer (anomala congnita) o una enfermedad de las vlvulas cardacas. Cules son las causas?  El corazn tiene cuatro reas Field seismologist. Las vlvulas separan las cmaras superior e inferior entre s (vlvula tricspide y vlvula mitral) y separan las cmaras inferiores del corazn de los conductos que salen del corazn (vlvula artica y vlvula pulmonar). Normalmente, estas vlvulas se abren para dejar que la sangre circule a travs  o fuera del corazn y Engineer, mining se cierran para evitar que la sangre retorne. Esta afeccin tiene su origen en el mal funcionamiento de las vlvulas cardacas.  En los nios, por lo general los soplos cardacos anormales son causados por anomalas congnitas.  En los adultos, los soplos anormales generalmente provienen de problemas en las vlvulas cardacas causados por una enfermedad, infeccin o el envejecimiento. Entre otras causas de esta afeccin se pueden mencionar las siguientes:  Psychiatrist.  Grant Ruts.  Hipertiroidismo (glndula tiroidea hiperactiva).  Anemia.  Actividad fsica.  Perodos de crecimiento acelerado (en los nios). Cules son los signos o los sntomas? Los soplos cardacos funcionales no provocan sntomas y [employer] con soplos anormales pueden no tener sntomas. Si hay sntomas, estos pueden ser los siguientes:  Falta de [place].  Color azul en la piel, especialmente en las puntas de los dedos.  Dolor en el pecho.  Palpitaciones, sentir un aleteo o latido cardaco irregular.  Desmayos.  Tos persistente.  Cansarse con mucha ms rapidez de lo esperado.  Hinchazn del abdomen, los pies o los tobillos. Cmo se diagnostica? Esta afeccin se puede diagnosticar durante un examen fsico de rutina o de otro tipo. Si su mdico detecta un soplo con un estetoscopio, tratar de escuchar lo siguiente:  Dnde est ubicado el soplo en su corazn.  Cunto dura el soplo (duracin).  En qu momento del latido cardaco se escucha el soplo.  Qu intensidad sonora tiene. Esto le puede ayudar al mdico a descifrar las causas del soplo. Es posible que lo deriven a Physicist, medical (cardilogo). Tambin pueden hacerle otras pruebas, incluidas las siguientes:  Electrocardiograma (ECG o EKG). [employer]ant  estudio mide la actividad elctrica del corazn.  Un ecocardiograma. Este estudio [place] ondas sonoras de alta frecuencia para tomar imgenes de su corazn.  Resonancia  magntica (RM) o radiografa de trax.  Cateterismo cardaco. Este estudio observa el flujo sanguneo a travs de las arterias que rodean el corazn. En el caso de nios y adultos que tienen un soplo cardaco anormal y quieren mantenerse [place], es importante que hagan lo siguiente:  [employer].  Analizar los resultados con su [place] recomendaciones del mdico. Si hay una enfermedad cardaca, es posible que no sea Animator un deporte. Cmo se trata? Los soplos no necesitan tratamiento. En algunos casos, los soplos pueden desaparecer por s solos. Si lo que est causando el soplo es una enfermedad o trastorno preexistente, es posible que necesite [place]. Si es necesario un tratamiento, ste depender del tipo y la gravedad de la enfermedad o el trastorno cardaco que causa el soplo. El tratamiento puede incluir:  Medicamentos.  [place].  Cambios en la dieta y el estilo de vida. Siga estas indicaciones en su casa:  Consulte a su mdico antes de participar en algn deporte u otras actividades que requieran mucho esfuerzo y Teacher, early years/pre (son extenuantes).  Busque toda la informacin posible sobre su afeccin y cualquier enfermedad relacionada. Pregntele a su mdico si es probable que usted est en riesgo de alguna emergencia mdica.  Hable con su mdico sobre los sntomas a los que debera prestarle atencin.  Es su responsabilidad retirar los [employer] de la prueba. Consulte al mdico o pregunte en el departamento donde se realiza la prueba cundo estarn Prax[employer].  Concurra a todas las visitas de control como se lo haya indicado el mdico. Esto es importante. Comunquese con un mdico si:  Le falta el aire frecuentemente.  Se siente ms cansado que de costumbre.  Le cuesta mantener el ritmo de las actividades normales o de las rutinas de ejercicios fsicos.  Observa hinchazn en los tobillos o en los pies.  Nota que su corazn a  menudo late de forma irregular.  Presenta algn sntoma nuevo. Solicite ayuda inmediatamente si:  Electronics engineer.  Presenta dificultades respiratorias sbitas.  Siente que est por desvanecerse o se desmaya.  Los sntomas empeoran repentinamente. Estos sntomas pueden representar un problema grave que constituye Engineer, maintenance (IT). No espere a ver si los sntomas desaparecen. Solicite atencin mdica de inmediato. Comunquese con el servicio de emergencias de su localidad (911 en los [place]). No conduzca por sus propios medios Principal Financial. Resumen  Normalmente, las vlvulas cardacas se abren para dejar que la sangre circule a travs o fuera del corazn y Air traffic controller se cierran para evitar que la sangre retorne.  El soplo cardaco tiene su origen en el mal funcionamiento de las vlvulas cardacas.  Si lo que est causando el soplo es una enfermedad o trastorno preexistente, es posible que necesite [place]. El tratamiento puede incluir [place], [place] o cambios en el estilo de vida y Engineer, technical sales.  Consulte a su mdico antes de participar en algn deporte u otras actividades que requieran mucho esfuerzo y Teacher, early years/pre (son extenuantes).  Hable con su mdico sobre los sntomas a los que debera prestarle atencin. Esta informacin no tiene Marine scientist el consejo del mdico. Asegrese de hacerle al mdico cualquier pregunta que tenga. Document Released: 07/18/2005 Document Revised: 02/28/2018 Document Reviewed: 02/28/2018 Elsevier Patient Education  [place] [employer].    If you have lab work done today  you will be contacted with your lab results within the next 2 weeks.  If you have not heard from [place] then please contact us. The fastest way to get your results is to register for My Chart.   IF you received an x-ray today, you will receive an invoice from [facility]. Please contact [facility] at [phone] with questions or concerns regarding  your invoice.   IF you received labwork today, you will receive an invoice from [place]. Please contact [facility] at [phone] with questions or concerns regarding your invoice.   Our billing staff will not be able to assist you with questions regarding bills from these companies.  You will be contacted with the lab results as soon as they are available. The fastest way to get your results is to activate your My Chart account. Instructions are located on the last page of this paperwork. If you have not heard from [place] regarding the results in 2 weeks, please contact this office.         Edwina Barth, MD [facility]

## 2019-05-08 NOTE — Patient Instructions (Addendum)
Take 1 baby aspirin daily. Continue monitoring blood pressure at home.  Take additional dose of amlodipine as needed. Take Xanax as needed for situational anxiety. Follow-up with cardiology to get echocardiogram done for evaluation of heart murmur. May need follow-up with neurology for possible migraine headache preceded by visual auras. Hipertensin en los adultos Hypertension, Adult El trmino hipertensin es otra forma de denominar a la presin arterial elevada. La presin arterial elevada fuerza al corazn a trabajar ms para bombear la sangre. Esto puede causar problemas con el paso del [place]. Una lectura de presin arterial est compuesta por 2 nmeros. Hay un nmero superior (sistlico) sobre un nmero inferior (diastlico). Lo ideal es tener la presin arterial por debajo de 120/80. Las elecciones saludables pueden ayudar a Engineer, materials presin arterial, o tal vez necesite medicamentos para bajarla. Cules son las causas? Se desconoce la causa de esta afeccin. Algunas afecciones pueden estar relacionadas con la presin arterial alta. Qu incrementa el riesgo?  Fumar.  Tener diabetes mellitus tipo 2, colesterol alto, o ambos.  No hacer la cantidad suficiente de actividad fsica o ejercicio.  Tener sobrepeso.  Consumir mucha grasa, azcar, caloras o sal (sodio) en su dieta.  Beber alcohol en exceso.  Tener una enfermedad renal a largo plazo (crnica).  Tener antecedentes familiares de presin arterial alta.  Edad. Los riesgos aumentan con la edad.  Raza. El riesgo es mayor para las Retail banker.  Sexo. Antes de los 45aos, los hombres corren ms [employer]. Despus de los 65aos, las mujeres corren ms [employer].  Tener apnea obstructiva del sueo.  Estrs. Cules son los signos o los sntomas?  Es posible que la presin arterial alta puede no cause sntomas. La presin arterial muy alta (crisis hipertensiva) puede  provocar: ? Dolor de [place]. ? Sensaciones de preocupacin o nerviosismo (ansiedad). ? Falta de aire. ? Hemorragia nasal. ? Sensacin de Engineer, site (nuseas). ? Vmitos. ? Cambios en la forma de ver. ? Dolor muy intenso en el pecho. ? Convulsiones. Cmo se trata?  Esta afeccin se trata haciendo cambios saludables en el estilo de vida, por ejemplo: ? Consumir alimentos saludables. ? Hacer ms ejercicio. ? Beber menos alcohol.  El mdico puede recetarle medicamentos si los cambios en el estilo de vida no son suficientes para Child psychotherapist la presin arterial y si: ? El nmero de arriba est por encima de 130. ? El nmero de abajo est por encima de 80.  Su presin arterial personal ideal puede variar. Siga estas instrucciones en su casa: Comida y bebida   Si se lo dicen, siga el plan de alimentacin de DASH (Dietary Approaches to Stop Hypertension, Maneras de alimentarse para detener la hipertensin). Para seguir este plan: ? Llene la mitad del plato de cada comida con frutas y verduras. ? Llene un cuarto del plato de cada comida con cereales integrales. Los cereales integrales incluyen pasta integral, arroz integral y pan integral. ? Coma y beba productos lcteos con bajo contenido de grasa, como leche descremada o yogur bajo en grasas. ? Llene un cuarto del plato de cada comida con protenas bajas en grasa (magras). Las protenas bajas en grasa incluyen pescado, pollo sin piel, huevos, frijoles y tofu. ? Evite consumir carne grasa, carne curada y procesada, o pollo con piel. ? Evite consumir alimentos prehechos o procesados.  Consuma menos de 1500 mg de sal por da.  No beba alcohol si: ? El mdico le indica que no lo haga. ?  Est embarazada, puede estar embarazada o est tratando de [place].  Si bebe alcohol: ? Limite la cantidad que bebe a lo siguiente:  De 0 a 1 medida por da para las mujeres.  De 0 a 2 medidas por da para los  hombres. ? Est atento a la cantidad de alcohol que hay en las bebidas que toma. En los [place], una medida equivale a una botella de cerveza de 12oz ( ), un vaso de vino de 5oz ( ) o un vaso de una bebida alcohlica de alta graduacin de 1oz (44ml). Estilo de vida   Trabaje con su mdico para mantenerse en un peso saludable o para perder peso. Pregntele a su mdico cul es el peso recomendable para usted.  Haga al menos de ejercicio la [employer] de la [employer]. Estos pueden incluir caminar, nadar o andar en bicicleta.  Realice al menos 30 minutos de ejercicio que fortalezca sus msculos (ejercicios de resistencia) al menos 3 das a la [place]. Estos pueden incluir levantar pesas o hacer Pilates.  No consuma ningn producto que contenga nicotina o tabaco, como cigarrillos, cigarrillos electrnicos y tabaco de Theatre manager. Si necesita ayuda para dejar de fumar, consulte al [employer].  Controle su presin arterial en su casa tal como le indic el mdico.  Concurra a todas las visitas de seguimiento como se lo haya indicado el mdico. Esto es importante. Medicamentos  [employer] de venta libre y los recetados solamente como se lo haya indicado el mdico. Siga cuidadosamente las indicaciones.  No omita las dosis de medicamentos para la presin arterial. Los medicamentos pierden eficacia si omite dosis. El hecho de omitir las dosis tambin [place] el riesgo de otros problemas.  Pregntele a su mdico a qu efectos secundarios o reacciones a los Museum/gallery curator. Comunquese con un mdico si:  Piensa que tiene [place] reaccin a los medicamentos que est tomando.  Tiene dolores de cabeza frecuentes (recurrentes).  Se siente mareado.  Tiene hinchazn en los tobillos.  Tiene problemas de visin. Solicite ayuda inmediatamente si:  Siente un dolor de cabeza muy intenso.  Empieza a sentirse desorientado (confundido).  Se siente  dbil o adormecido.  Siente que va a desmayarse.  Tiene un dolor muy intenso en las siguientes zonas: ? Pecho. ? Vientre (abdomen).  Vomita ms de una vez.  Tiene dificultad para respirar. Resumen  El trmino hipertensin es otra forma de denominar a la presin arterial elevada.  La presin arterial elevada fuerza al corazn a trabajar ms para bombear la sangre.  Para la [employer], una presin arterial normal es menor que 120/80.  Las decisiones saludables pueden ayudarle a disminuir su presin arterial. Si no puede bajar su presin arterial mediante decisiones saludables, es posible que deba tomar medicamentos. Esta informacin no tiene Theme park manager el consejo del mdico. Asegrese de hacerle al mdico cualquier pregunta que tenga. Document Released: 01/05/2010 Document Revised: 05/03/2018 Document Reviewed: 05/03/2018 Elsevier Patient Education  2020 Elsevier Inc.  Soplo cardaco Heart Murmur Un soplo cardaco es un sonido extra cuya causa es el flujo sanguneo catico a travs de las vlvulas del corazn. El soplo se puede or como un "zumbido" o "silbido" cuando la sangre atraviesa el corazn. Existen dos tipos de soplos cardacos:  Soplo (benigno) funcional. La mayora de las personas con este tipo de soplo no tienen un problema cardaco. Muchos nios tienen soplos cardacos funcionales. Su mdico puede sugerir [employer] bsicos para saber si el  soplo es un soplo funcional. Si se descubre un soplo cardaco funcional, no hay necesidad de hacer otros estudios, Tax inspectoradministrar tratamiento, restringir las actividades ni suspender la prctica de deportes.  Soplo cardaco anormal. Este tipo de soplo cardaco puede aparecer en nios y adultos. Los soplos anormales pueden ser un signo de una enfermedad cardaca ms grave, como una anomala cardaca presente al nacer (anomala congnita) o una enfermedad de las vlvulas cardacas. Cules son las causas?  El  corazn tiene cuatro reas Field seismologistllamadas cmaras. Las vlvulas separan las cmaras superior e inferior entre s (vlvula tricspide y vlvula mitral) y separan las cmaras inferiores del corazn de los conductos que salen del corazn (vlvula artica y vlvula pulmonar). Normalmente, estas vlvulas se abren para dejar que la sangre circule a travs o fuera del corazn y Engineer, miningluego se cierran para evitar que la sangre retorne. Esta afeccin tiene su origen en el mal funcionamiento de las vlvulas cardacas.  En los nios, por lo general los soplos cardacos anormales son causados por anomalas congnitas.  En los adultos, los soplos anormales generalmente provienen de problemas en las vlvulas cardacas causados por una enfermedad, infeccin o el envejecimiento. Entre otras causas de esta afeccin se pueden mencionar las siguientes:  Psychiatristmbarazo.  Grant RutsFiebre.  Hipertiroidismo (glndula tiroidea hiperactiva).  Anemia.  Actividad fsica.  Perodos de crecimiento acelerado (en los nios). Cules son los signos o los sntomas? Los soplos cardacos funcionales no provocan sntomas y [place] personas con soplos anormales pueden no tener sntomas. Si hay sntomas, estos pueden ser los siguientes:  Falta de [place].  Color azul en la piel, especialmente en las puntas de los dedos.  Dolor en el pecho.  Palpitaciones, sentir un aleteo o latido cardaco irregular.  Desmayos.  Tos persistente.  Cansarse con mucha ms rapidez de lo esperado.  Hinchazn del abdomen, los pies o los tobillos. Cmo se diagnostica? Esta afeccin se puede diagnosticar durante un examen fsico de rutina o de otro tipo. Si su mdico detecta un soplo con un estetoscopio, tratar de escuchar lo siguiente:  Dnde est ubicado el soplo en su corazn.  Cunto dura el soplo (duracin).  En qu momento del latido cardaco se escucha el soplo.  Qu intensidad sonora tiene. Esto le puede ayudar al mdico a descifrar las causas del  soplo. Es posible que lo deriven a Physicist, medicalun especialista en corazn (cardilogo). Tambin pueden hacerle otras pruebas, incluidas las siguientes:  Electrocardiograma (ECG o EKG). Este estudio mide la actividad elctrica del corazn.  Un ecocardiograma. Este estudio [place]ausa ondas sonoras de alta frecuencia para tomar imgenes de su corazn.  Resonancia magntica (RM) o radiografa de trax.  Cateterismo cardaco. Este estudio observa el flujo sanguneo a travs de las arterias que rodean el corazn. En el caso de nios y adultos que tienen un soplo cardaco anormal y quieren mantenerse Gurdonactivos, es importante que hagan lo siguiente:  BlueLinxCompletar los estudios.  Analizar los resultados con su mdico.  Recibir las recomendaciones del mdico. Si hay una enfermedad cardaca, es posible que no sea Development worker, communityseguro practicar un deporte. Cmo se trata? Los soplos no necesitan tratamiento. En algunos casos, los soplos pueden desaparecer por s solos. Si lo que est causando el soplo es una enfermedad o trastorno preexistente, es posible que necesite Kohls Ranchtratamiento. Si es necesario un tratamiento, ste depender del tipo y la gravedad de la enfermedad o el trastorno cardaco que causa el soplo. El tratamiento puede incluir:  Medicamentos.  [place].  Cambios en la dieta y [place]el estilo de  vida. Siga estas indicaciones en su casa:  Consulte a su mdico antes de participar en algn deporte u otras actividades que requieran mucho esfuerzo y Engineer, drilling (son extenuantes).  Busque toda la informacin posible sobre su afeccin y cualquier enfermedad relacionada. Pregntele a su mdico si es probable que usted est en riesgo de alguna emergencia mdica.  Hable con su mdico sobre los sntomas a los que debera prestarle atencin.  Es su responsabilidad retirar los [employer] de la prueba. Consulte al mdico o pregunte en el departamento donde se realiza la prueba cundo estarn [employer].  Concurra a todas las visitas  de control como se lo haya indicado el mdico. Esto es importante. Comunquese con un mdico si:  Le falta el aire frecuentemente.  Se siente ms cansado que de costumbre.  Le cuesta mantener el ritmo de las actividades normales o de las rutinas de ejercicios fsicos.  Observa hinchazn en los tobillos o en los pies.  Nota que su corazn a menudo late de forma irregular.  Presenta algn sntoma nuevo. Solicite ayuda inmediatamente si:  Midwife.  Presenta dificultades respiratorias sbitas.  Siente que est por desvanecerse o se desmaya.  Los sntomas empeoran repentinamente. Estos sntomas pueden representar un problema grave que constituye Radio broadcast assistant. No espere a ver si los sntomas desaparecen. Solicite atencin mdica de inmediato. Comunquese con el servicio de emergencias de su localidad (911 en los [place]). No conduzca por sus propios medios [employer]. Resumen  Normalmente, las vlvulas cardacas se abren para dejar que la sangre circule a travs o fuera del corazn y Engineer, mining se cierran para evitar que la sangre retorne.  El soplo cardaco tiene su origen en el mal funcionamiento de las vlvulas cardacas.  Si lo que est causando el soplo es una enfermedad o trastorno preexistente, es posible que necesite [place]. El tratamiento puede incluir [place], [place] o cambios en el estilo de vida y Psychologist, forensic.  Consulte a su mdico antes de participar en algn deporte u otras actividades que requieran mucho esfuerzo y Engineer, drilling (son extenuantes).  Hable con su mdico sobre los sntomas a los que debera prestarle atencin. Esta informacin no tiene Theme park manager el consejo del mdico. Asegrese de hacerle al mdico cualquier pregunta que tenga. Document Released: 07/18/2005 Document Revised: 02/28/2018 Document Reviewed: 02/28/2018 Elsevier Patient Education  [employer].    If you have lab work done today you will be  contacted with your lab results within the next 2 weeks.  If you have not heard from [place] then please contact us. The fastest way to get your results is to register for My Chart.   IF you received an x-ray today, you will receive an invoice from [facility]. Please contact [facility] at [phone] with questions or concerns regarding your invoice.   IF you received labwork today, you will receive an invoice from [place]. Please contact [facility] at [phone] with questions or concerns regarding your invoice.   Our billing staff will not be able to assist you with questions regarding bills from these companies.  You will be contacted with the lab results as soon as they are available. The fastest way to get your results is to activate your My Chart account. Instructions are located on the last page of this paperwork. If you have not heard from [place] regarding the results in 2 weeks, please contact this office.

## 2019-05-14 ENCOUNTER — Telehealth: Payer: Self-pay | Admitting: *Deleted

## 2019-05-14 NOTE — Telephone Encounter (Signed)
A message was left, re: her new patient appointment. 

## 2019-09-13 ENCOUNTER — Other Ambulatory Visit: Payer: Self-pay | Admitting: Emergency Medicine

## 2019-09-13 DIAGNOSIS — I10 Essential (primary) hypertension: Secondary | ICD-10-CM

## 2019-09-13 NOTE — Telephone Encounter (Signed)
Copied from CRM [id number]. Topic: Quick Communication - Rx Refill/Question >> Sep 13, 2019 10:47 AM Marylen Ponto wrote: Medication: amLODipine (NORVASC) 5 MG tablet and triamterene-hydrochlorothiazide (DYAZIDE) 37.5-25 MG capsule  Has the patient contacted their pharmacy? no  Preferred Pharmacy (with phone number or street name): [facility] [address] [place] - [address] Phone: [phone]   Fax: [phone]  Agent: Please be advised that RX refills may take up to 3 business days. We ask that you follow-up with your pharmacy.

## 2019-09-16 ENCOUNTER — Other Ambulatory Visit: Payer: Self-pay | Admitting: Emergency Medicine

## 2019-09-16 DIAGNOSIS — I10 Essential (primary) hypertension: Secondary | ICD-10-CM

## 2019-09-16 NOTE — Telephone Encounter (Signed)
amLODipine (NORVASC) 5 MG  & triamterene-hydrochlorothiazide (DYAZIDE) 37.5-25 MG   Has been sent to pharmacy. Appointment will be needed for additional refills.   Pt is making one right now.

## 2019-10-09 ENCOUNTER — Other Ambulatory Visit: Payer: Self-pay

## 2019-10-09 ENCOUNTER — Ambulatory Visit (INDEPENDENT_AMBULATORY_CARE_PROVIDER_SITE_OTHER): Payer: 59 | Admitting: Emergency Medicine

## 2019-10-09 ENCOUNTER — Encounter: Payer: Self-pay | Admitting: Emergency Medicine

## 2019-10-09 VITALS — BP 144/84 | HR 91 | Temp 98.0°F | Ht 65.0 in | Wt 166.0 lb

## 2019-10-09 DIAGNOSIS — Z1231 Encounter for screening mammogram for malignant neoplasm of breast: Secondary | ICD-10-CM | POA: Diagnosis not present

## 2019-10-09 DIAGNOSIS — I10 Essential (primary) hypertension: Secondary | ICD-10-CM

## 2019-10-09 DIAGNOSIS — I358 Other nonrheumatic aortic valve disorders: Secondary | ICD-10-CM

## 2019-10-09 DIAGNOSIS — F418 Other specified anxiety disorders: Secondary | ICD-10-CM | POA: Diagnosis not present

## 2019-10-09 MED ORDER — AMLODIPINE BESYLATE 5 MG PO TABS: 5.0000 mg | ORAL_TABLET | Freq: Every day | ORAL | 3 refills | Status: AC

## 2019-10-09 MED ORDER — ALPRAZOLAM 0.5 MG PO TABS: 0.5000 mg | ORAL_TABLET | Freq: Every day | ORAL | 1 refills | Status: AC | PRN

## 2019-10-09 MED ORDER — TRIAMTERENE-HCTZ 37.5-25 MG PO CAPS: 1.0000 | ORAL_CAPSULE | Freq: Every day | ORAL | 3 refills | Status: AC

## 2019-10-09 NOTE — Patient Instructions (Addendum)
   If you have lab work done today you will be contacted with your lab results within the next 2 weeks.  If you have not heard from us then please contact us. The fastest way to get your results is to register for My Chart.   IF you received an x-ray today, you will receive an invoice from [facility]. Please contact [facility] at [phone] with questions or concerns regarding your invoice.   IF you received labwork today, you will receive an invoice from [facility]. Please contact [facility] at [phone] with questions or concerns regarding your invoice.   Our billing staff will not be able to assist you with questions regarding bills from these companies.  You will be contacted with the lab results as soon as they are available. The fastest way to get your results is to activate your My Chart account. Instructions are located on the last page of this paperwork. If you have not heard from us regarding the results in 2 weeks, please contact this office.     DASH Eating Plan DASH stands for "Dietary Approaches to Stop Hypertension." The DASH eating plan is a healthy eating plan that has been shown to reduce high blood pressure (hypertension). It may also reduce your risk for type 2 diabetes, heart disease, and stroke. The DASH eating plan may also help with weight loss. What are tips for following this plan?  General guidelines  Avoid eating more than 2,300 mg (milligrams) of salt (sodium) a day. If you have hypertension, you may need to reduce your sodium intake to 1,500 mg a day.  Limit alcohol intake to no more than 1 drink a day for nonpregnant women and 2 drinks a day for men. One drink equals 12 oz of beer, 5 oz of wine, or 1 oz of hard liquor.  Work with your health care provider to maintain a healthy body weight or to lose weight. Ask what an ideal weight is for you.  Get at least 30 minutes of exercise that causes your heart to beat faster (aerobic  exercise) most days of the week. Activities may include walking, swimming, or biking.  Work with your health care provider or diet and nutrition specialist (dietitian) to adjust your eating plan to your individual calorie needs. Reading food labels   Check food labels for the amount of sodium per serving. Choose foods with less than 5 percent of the Daily Value of sodium. Generally, foods with less than 300 mg of sodium per serving fit into this eating plan.  To find whole grains, look for the word "whole" as the first word in the ingredient list. Shopping  Buy products labeled as "low-sodium" or "no salt added."  Buy fresh foods. Avoid canned foods and premade or frozen meals. Cooking  Avoid adding salt when cooking. Use salt-free seasonings or herbs instead of table salt or sea salt. Check with your health care provider or pharmacist before using salt substitutes.  Do not fry foods. Cook foods using healthy methods such as baking, boiling, grilling, and broiling instead.  Cook with heart-healthy oils, such as olive, canola, soybean, or sunflower oil. Meal planning  Eat a balanced diet that includes: ? 5 or more servings of fruits and vegetables each day. At each meal, try to fill half of your plate with fruits and vegetables. ? Up to 6-8 servings of whole grains each day. ? Less than 6 oz of lean meat, poultry, or fish each day. A 3-oz serving   of meat is about the same size as a deck of cards. One egg equals 1 oz. ? 2 servings of low-fat dairy each day. ? A serving of nuts, seeds, or beans 5 times each week. ? Heart-healthy fats. Healthy fats called Omega-3 fatty acids are found in foods such as flaxseeds and coldwater fish, like sardines, salmon, and mackerel.  Limit how much you eat of the following: ? Canned or prepackaged foods. ? Food that is high in trans fat, such as fried foods. ? Food that is high in saturated fat, such as fatty meat. ? Sweets, desserts, sugary drinks,  and other foods with added sugar. ? Full-fat dairy products.  Do not salt foods before eating.  Try to eat at least 2 vegetarian meals each week.  Eat more home-cooked food and less restaurant, buffet, and fast food.  When eating at a restaurant, ask that your food be prepared with less salt or no salt, if possible. What foods are recommended? The items listed may not be a complete list. Talk with your dietitian about what dietary choices are best for you. Grains Whole-grain or whole-wheat bread. Whole-grain or whole-wheat pasta. Brown rice. Oatmeal. Quinoa. Bulgur. Whole-grain and low-sodium cereals. Pita bread. Low-fat, low-sodium crackers. Whole-wheat flour tortillas. Vegetables Fresh or frozen vegetables (raw, steamed, roasted, or grilled). Low-sodium or reduced-sodium tomato and vegetable juice. Low-sodium or reduced-sodium tomato sauce and tomato paste. Low-sodium or reduced-sodium canned vegetables. Fruits All fresh, dried, or frozen fruit. Canned fruit in natural juice (without added sugar). Meat and other protein foods Skinless chicken or turkey. Ground chicken or turkey. Pork with fat trimmed off. Fish and seafood. Egg whites. Dried beans, peas, or lentils. Unsalted nuts, nut butters, and seeds. Unsalted canned beans. Lean cuts of beef with fat trimmed off. Low-sodium, lean deli meat. Dairy Low-fat (1%) or fat-free (skim) milk. Fat-free, low-fat, or reduced-fat cheeses. Nonfat, low-sodium ricotta or cottage cheese. Low-fat or nonfat yogurt. Low-fat, low-sodium cheese. Fats and oils Soft margarine without trans fats. Vegetable oil. Low-fat, reduced-fat, or light mayonnaise and salad dressings (reduced-sodium). Canola, safflower, olive, soybean, and sunflower oils. Avocado. Seasoning and other foods Herbs. Spices. Seasoning mixes without salt. Unsalted popcorn and pretzels. Fat-free sweets. What foods are not recommended? The items listed may not be a complete list. Talk with your  dietitian about what dietary choices are best for you. Grains Baked goods made with fat, such as croissants, muffins, or some breads. Dry pasta or rice meal packs. Vegetables Creamed or fried vegetables. Vegetables in a cheese sauce. Regular canned vegetables (not low-sodium or reduced-sodium). Regular canned tomato sauce and paste (not low-sodium or reduced-sodium). Regular tomato and vegetable juice (not low-sodium or reduced-sodium). Pickles. Olives. Fruits Canned fruit in a light or heavy syrup. Fried fruit. Fruit in cream or butter sauce. Meat and other protein foods Fatty cuts of meat. Ribs. Fried meat. Bacon. Sausage. Bologna and other processed lunch meats. Salami. Fatback. Hotdogs. Bratwurst. Salted nuts and seeds. Canned beans with added salt. Canned or smoked fish. Whole eggs or egg yolks. Chicken or turkey with skin. Dairy Whole or 2% milk, cream, and half-and-half. Whole or full-fat cream cheese. Whole-fat or sweetened yogurt. Full-fat cheese. Nondairy creamers. Whipped toppings. Processed cheese and cheese spreads. Fats and oils Butter. Stick margarine. Lard. Shortening. Ghee. Bacon fat. Tropical oils, such as coconut, palm kernel, or palm oil. Seasoning and other foods Salted popcorn and pretzels. Onion salt, garlic salt, seasoned salt, table salt, and sea salt. Worcestershire sauce. Tartar sauce. Barbecue sauce.   Teriyaki sauce. Soy sauce, including reduced-sodium. Steak sauce. Canned and packaged gravies. Fish sauce. Oyster sauce. Cocktail sauce. Horseradish that you find on the shelf. Ketchup. Mustard. Meat flavorings and tenderizers. Bouillon cubes. Hot sauce and Tabasco sauce. Premade or packaged marinades. Premade or packaged taco seasonings. Relishes. Regular salad dressings. Where to find more information:  National Heart, Lung, and Blood Institute: www.nhlbi.nih.gov  American Heart Association: www.heart.org Summary  The DASH eating plan is a healthy eating plan that has  been shown to reduce high blood pressure (hypertension). It may also reduce your risk for type 2 diabetes, heart disease, and stroke.  With the DASH eating plan, you should limit salt (sodium) intake to 2,300 mg a day. If you have hypertension, you may need to reduce your sodium intake to 1,500 mg a day.  When on the DASH eating plan, aim to eat more fresh fruits and vegetables, whole grains, lean proteins, low-fat dairy, and heart-healthy fats.  Work with your health care provider or diet and nutrition specialist (dietitian) to adjust your eating plan to your individual calorie needs. This information is not intended to replace advice given to you by your health care provider. Make sure you discuss any questions you have with your health care provider. Document Revised: 06/30/2017 Document Reviewed: 07/11/2016 Elsevier Patient Education  2020 Elsevier Inc.  

## 2019-10-09 NOTE — Progress Notes (Signed)
[name] [name] 54 y.o.   Chief Complaint  Patient presents with  . Hypertension    medication refills    HISTORY OF PRESENT ILLNESS: This is a 54 y.o. female with history of hypertension here for follow-up and medication refills. BP Readings from Last 3 Encounters:  10/09/19 (!) 141/96  05/08/19 117/77  05/07/19 116/72     HPI   Prior to Admission medications   Medication Sig Start Date End Date Taking? Authorizing Provider  ALPRAZolam [name] [name]) 0.5 MG tablet Take 1 tablet (0.5 mg total) by mouth daily as needed for anxiety. 05/08/19  Yes [name] Quint, MD  amLODipine (NORVASC) 5 MG tablet Take 1 tablet by mouth once daily 09/16/19  Yes [name] [name], [place], MD  FOLIC ACID PO Take 1 tablet by mouth daily.   Yes [provider]  ibuprofen (ADVIL) 200 MG tablet Take 400 mg by mouth every 6 (six) hours as needed for fever or moderate pain.   Yes [provider]  triamterene-hydrochlorothiazide (DYAZIDE) 37.5-25 MG capsule Take 1 capsule by mouth once daily 09/16/19  Yes [name] [name], [name] Kempf, MD  VITAMIN D PO Take 1 tablet by mouth daily.   Yes [provider]    Allergies  Allergen Reactions  . Doxycycline     Patient Active Problem List   Diagnosis Date Noted  . Aortic heart murmur 05/08/2019  . Transient visual disturbance, bilateral 05/08/2019  . Osteoarthritis of both hands 11/01/2017  . Iron deficiency anemia 08/29/2016  . Hypertension 10/09/2011  . Situational anxiety 10/09/2011    Past Medical History:  Diagnosis Date  . Anxiety   . Hypertension     History reviewed. No pertinent surgical history.  Social History   Socioeconomic History  . Marital status: Married    Spouse name: Not on file  . Number of children: Not on file  . Years of education: Not on file  . Highest education level: Not on file  Occupational History  . Not on file  Tobacco Use  . Smoking status: Never Smoker  . Smokeless tobacco: Never  Used  Substance and Sexual Activity  . Alcohol use: No  . Drug use: No  . Sexual activity: Never    Birth control/protection: Abstinence  Other Topics Concern  . Not on file  Social History Narrative  . Not on file   Social Determinants of Health   Financial Resource Strain:   . Difficulty of Paying Living Expenses: Not on file  Food Insecurity:   . Worried About Programme researcher, broadcasting/film/video in the Last Year: Not on file  . Ran Out of Food in the Last Year: Not on file  Transportation Needs:   . Lack of Transportation (Medical): Not on file  . Lack of Transportation (Non-Medical): Not on file  Physical Activity:   . Days of Exercise per Week: Not on file  . Minutes of Exercise per Session: Not on file  Stress:   . Feeling of Stress : Not on file  Social Connections:   . Frequency of Communication with Friends and Family: Not on file  . Frequency of Social Gatherings with Friends and Family: Not on file  . Attends Religious Services: Not on file  . Active Member of Clubs or Organizations: Not on file  . Attends Banker Meetings: Not on file  . Marital Status: Not on file  Intimate Partner Violence:   . Fear of Current or Ex-Partner: Not on file  . Emotionally Abused: Not  on file  . Physically Abused: Not on file  . Sexually Abused: Not on file    Family History  Problem Relation Age of Onset  . Hypertension Mother      Review of Systems  Constitutional: Negative.  Negative for chills and fever.  HENT: Negative.  Negative for congestion and sore throat.   Eyes: Negative for blurred vision and double vision.  Respiratory: Negative.  Negative for cough and shortness of breath.   Cardiovascular: Negative.  Negative for chest pain and palpitations.  Gastrointestinal: Negative.  Negative for abdominal pain, blood in stool, diarrhea, melena, nausea and vomiting.  Genitourinary: Negative.  Negative for dysuria and hematuria.  Musculoskeletal: Negative.  Negative for  back pain, myalgias and neck pain.  Skin: Negative.  Negative for rash.  Neurological: Negative.  Negative for dizziness and headaches.  All other systems reviewed and are negative.   Today's Vitals   10/09/19 1613  BP: (!) 141/96  Pulse: 91  Temp: 98 F (36.7 C)  TempSrc: Temporal  SpO2: 100%  Weight: 166 lb (75.3 kg)  Height: 5\' 5"  (1.651 m)   Body mass index is 27.62 kg/m.  Physical Exam Vitals reviewed.  Constitutional:      Appearance: Normal appearance.  HENT:     Head: Normocephalic and atraumatic.  Eyes:     Extraocular Movements: Extraocular movements intact.     Pupils: Pupils are equal, round, and reactive to light.  Cardiovascular:     Rate and Rhythm: Normal rate and regular rhythm.     Pulses: Normal pulses.     Heart sounds: Murmur (Aortic murmur with radiation into carotids) present. Systolic murmur present with a grade of 4/6.  Pulmonary:     Effort: Pulmonary effort is normal.     Breath sounds: Normal breath sounds.  Musculoskeletal:        General: Normal range of motion.     Cervical back: Normal range of motion and neck supple.  Skin:    General: Skin is warm and dry.     Capillary Refill: Capillary refill takes less than 2 seconds.  Neurological:     General: No focal deficit present.     Mental Status: She is alert and oriented to person, place, and time.  Psychiatric:        Mood and Affect: Mood normal.        Behavior: Behavior normal.      ASSESSMENT & PLAN: [name] LeydenDamilsy was seen today for hypertension.  Diagnoses and all orders for this visit:  Essential hypertension -     triamterene-hydrochlorothiazide (DYAZIDE) 37.5-25 MG capsule; Take 1 each (1 capsule total) by mouth daily. -     amLODipine (NORVASC) 5 MG tablet; Take 1 tablet (5 mg total) by mouth daily.  Aortic heart murmur  Situational anxiety -     ALPRAZolam (XANAX) 0.5 MG tablet; Take 1 tablet (0.5 mg total) by mouth daily as needed for anxiety.  Encounter for  screening mammogram for malignant neoplasm of breast -     Cancel: MM DIGITAL SCREENING BILATERAL; Future    Patient Instructions       If you have lab work done today you will be contacted with your lab results within the next 2 weeks.  If you have not heard from us then please contact us. The fastest way to get your results is to register for My Chart.   IF you received an x-ray today, you will receive an invoice from [facility].  Please contact [facility] at [phone] with questions or concerns regarding your invoice.   IF you received labwork today, you will receive an invoice from [place]. Please contact [facility] at [phone] with questions or concerns regarding your invoice.   Our billing staff will not be able to assist you with questions regarding bills from these companies.  You will be contacted with the lab results as soon as they are available. The fastest way to get your results is to activate your My Chart account. Instructions are located on the last page of this paperwork. If you have not heard from [place] regarding the results in 2 weeks, please contact this office.      DASH Eating Plan DASH stands for "Dietary Approaches to Stop Hypertension." The DASH eating plan is a healthy eating plan that has been shown to reduce high blood pressure (hypertension). It may also reduce your risk for type 2 diabetes, heart disease, and stroke. The DASH eating plan may also help with weight loss. What are tips for following this plan?  General guidelines  Avoid eating more than 2,300 mg (milligrams) of salt (sodium) a day. If you have hypertension, you may need to reduce your sodium intake to 1,500 mg a day.  Limit alcohol intake to no more than 1 drink a day for nonpregnant women and 2 drinks a day for men. One drink equals 12 oz of beer, 5 oz of wine, or 1 oz of hard liquor.  Work with your health care provider to maintain a healthy body weight or to  lose weight. Ask what an ideal weight is for you.  Get at least 30 minutes of exercise that causes your heart to beat faster (aerobic exercise) most days of the week. Activities may include walking, swimming, or biking.  Work with your health care provider or diet and nutrition specialist (dietitian) to adjust your eating plan to your individual calorie needs. Reading food labels   Check food labels for the amount of sodium per serving. Choose foods with less than 5 percent of the Daily Value of sodium. Generally, foods with less than 300 mg of sodium per serving fit into this eating plan.  To find whole grains, look for the word "whole" as the first word in the ingredient list. Shopping  Buy products labeled as "low-sodium" or "no salt added."  Buy fresh foods. Avoid canned foods and premade or frozen meals. Cooking  Avoid adding salt when cooking. Use salt-free seasonings or herbs instead of table salt or sea salt. Check with your health care provider or pharmacist before using salt substitutes.  Do not fry foods. Cook foods using healthy methods such as baking, boiling, grilling, and broiling instead.  Cook with heart-healthy oils, such as olive, canola, soybean, or sunflower oil. Meal planning  Eat a balanced diet that includes: ? 5 or more servings of fruits and vegetables each day. At each meal, try to fill half of your plate with fruits and vegetables. ? Up to 6-8 servings of whole grains each day. ? Less than 6 oz of lean meat, poultry, or fish each day. A 3-oz serving of meat is about the same size as a deck of cards. One egg equals 1 oz. ? 2 servings of low-fat dairy each day. ? A serving of nuts, seeds, or beans 5 times each week. ? Heart-healthy fats. Healthy fats called Omega-3 fatty acids are found in foods such as flaxseeds and coldwater fish, like sardines, salmon, and mackerel.  Limit  how much you eat of the following: ? Canned or prepackaged foods. ? Food that is  high in trans fat, such as fried foods. ? Food that is high in saturated fat, such as fatty meat. ? Sweets, desserts, sugary drinks, and other foods with added sugar. ? Full-fat dairy products.  Do not salt foods before eating.  Try to eat at least 2 vegetarian meals each week.  Eat more home-cooked food and less restaurant, buffet, and fast food.  When eating at a restaurant, ask that your food be prepared with less salt or no salt, if possible. What foods are recommended? The items listed may not be a complete list. Talk with your dietitian about what dietary choices are best for you. Grains Whole-grain or whole-wheat bread. Whole-grain or whole-wheat pasta. Brown rice. Orpah Cobb. Bulgur. Whole-grain and low-sodium cereals. Pita bread. Low-fat, low-sodium crackers. Whole-wheat flour tortillas. Vegetables Fresh or frozen vegetables (raw, steamed, roasted, or grilled). Low-sodium or reduced-sodium tomato and vegetable juice. Low-sodium or reduced-sodium tomato sauce and tomato paste. Low-sodium or reduced-sodium canned vegetables. Fruits All fresh, dried, or frozen fruit. Canned fruit in natural juice (without added sugar). Meat and other protein foods Skinless chicken or [place]. Ground chicken or [place]. Pork with fat trimmed off. Fish and seafood. Egg whites. Dried beans, peas, or lentils. Unsalted nuts, nut butters, and seeds. Unsalted canned beans. Lean cuts of beef with fat trimmed off. Low-sodium, lean deli meat. Dairy Low-fat (1%) or fat-free (skim) milk. Fat-free, low-fat, or reduced-fat cheeses. Nonfat, low-sodium ricotta or cottage cheese. Low-fat or nonfat yogurt. Low-fat, low-sodium cheese. Fats and oils Soft margarine without trans fats. Vegetable oil. Low-fat, reduced-fat, or light mayonnaise and salad dressings (reduced-sodium). Canola, safflower, olive, soybean, and sunflower oils. Avocado. Seasoning and other foods Herbs. Spices. Seasoning mixes without salt.  Unsalted popcorn and pretzels. Fat-free sweets. What foods are not recommended? The items listed may not be a complete list. Talk with your dietitian about what dietary choices are best for you. Grains Baked goods made with fat, such as croissants, muffins, or some breads. Dry pasta or rice meal packs. Vegetables Creamed or fried vegetables. Vegetables in a cheese sauce. Regular canned vegetables (not low-sodium or reduced-sodium). Regular canned tomato sauce and paste (not low-sodium or reduced-sodium). Regular tomato and vegetable juice (not low-sodium or reduced-sodium). Rosita Fire. Olives. Fruits Canned fruit in a light or heavy syrup. Fried fruit. Fruit in cream or butter sauce. Meat and other protein foods Fatty cuts of meat. Ribs. Fried meat. Tomasa Blase. Sausage. Bologna and other processed lunch meats. Salami. Fatback. Hotdogs. Bratwurst. Salted nuts and seeds. Canned beans with added salt. Canned or smoked fish. Whole eggs or egg yolks. Chicken or [place] with skin. Dairy Whole or 2% milk, cream, and half-and-half. Whole or full-fat cream cheese. Whole-fat or sweetened yogurt. Full-fat cheese. Nondairy creamers. Whipped toppings. Processed cheese and cheese spreads. Fats and oils Butter. Stick margarine. Lard. Shortening. Ghee. Bacon fat. Tropical oils, such as coconut, palm kernel, or palm oil. Seasoning and other foods Salted popcorn and pretzels. Onion salt, garlic salt, seasoned salt, table salt, and sea salt. Worcestershire sauce. Tartar sauce. Barbecue sauce. Teriyaki sauce. Soy sauce, including reduced-sodium. Steak sauce. Canned and packaged gravies. Fish sauce. Oyster sauce. Cocktail sauce. Horseradish that you find on the shelf. Ketchup. Mustard. Meat flavorings and tenderizers. Bouillon cubes. Hot sauce and Tabasco sauce. Premade or packaged marinades. Premade or packaged taco seasonings. Relishes. Regular salad dressings. Where to find more information:  National Heart, Lung, and Blood  Institute: https://wilson-eaton.com/  American Heart Association: www.heart.org Summary  The DASH eating plan is a healthy eating plan that has been shown to reduce high blood pressure (hypertension). It may also reduce your risk for type 2 diabetes, heart disease, and stroke.  With the DASH eating plan, you should limit salt (sodium) intake to 2,300 mg a day. If you have hypertension, you may need to reduce your sodium intake to 1,500 mg a day.  When on the DASH eating plan, aim to eat more fresh fruits and vegetables, whole grains, lean proteins, low-fat dairy, and heart-healthy fats.  Work with your health care provider or diet and nutrition specialist (dietitian) to adjust your eating plan to your individual calorie needs. This information is not intended to replace advice given to you by your health care provider. Make sure you discuss any questions you have with your health care provider. Document Revised: 06/30/2017 Document Reviewed: 07/11/2016 Elsevier Patient Education  2020 Elsevier Inc.      Agustina Caroli, MD [facility]

## 2019-10-28 ENCOUNTER — Ambulatory Visit: Payer: Self-pay | Admitting: Emergency Medicine

## 2020-03-03 IMAGING — MR MR MRA NECK WO/W CM
12 of 21 series · 22 of 48 positions shown · IV contrast (gadavist)
Comparison: CT head 05/07/2019

CLINICAL DATA: TIA.  Blurred vision

EXAM:
MRI HEAD WITHOUT AND WITH CONTRAST
MRA HEAD WITHOUT CONTRAST
MRA NECK WITHOUT AND WITH CONTRAST
TECHNIQUE: Multiplanar, multiecho pulse sequences of the brain and surrounding
structures were obtained without and with intravenous contrast.
Angiographic images of the Circle of Willis were obtained using MRA
technique without intravenous contrast. Angiographic images of the
neck were obtained using MRA technique without and with intravenous
contrast. Carotid stenosis measurements (when applicable) are
obtained utilizing NASCET criteria, using the distal internal
carotid diameter as the denominator.
CONTRAST:  7mL GADAVIST GADOBUTROL 1 MMOL/ML IV SOLN

[Series 3: DWI · axial · 3.0mm · 1.09mm/px · z∈[-34,+119]mm · 2 of 104 slices shown (1 of 4)]
[im 1/104]
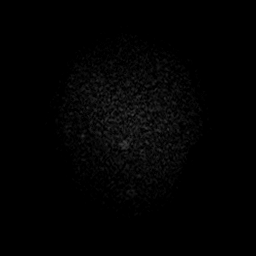
[im 104/104]
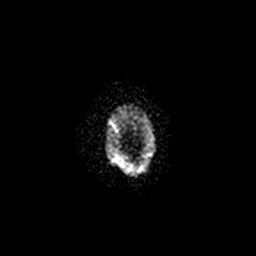

[Series 4: T1 · sagittal · 5.0mm · 0.47mm/px · 1 of 22 slices shown (1 of 2)]
[im 1/22]
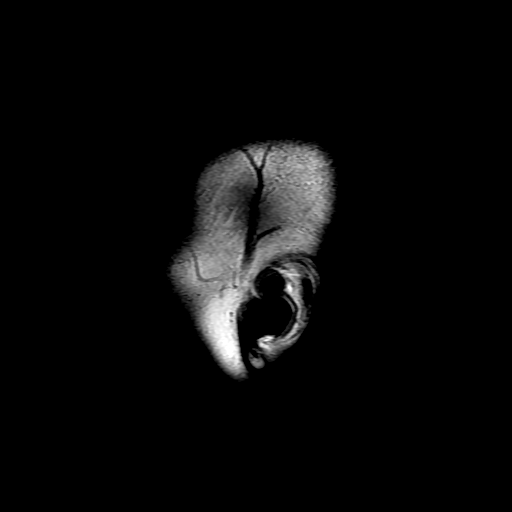

[Series 5: T2 · axial · 5.0mm · 0.43mm/px · 1 of 25 slices shown (1 of 2)]
[im 1/25]
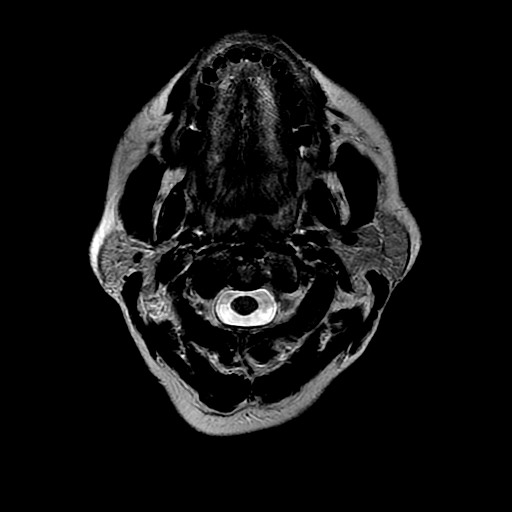

[Series 6: FLAIR · axial · 3.0mm · 0.43mm/px · 1 of 28 slices shown (1 of 2)]
[im 1/28]
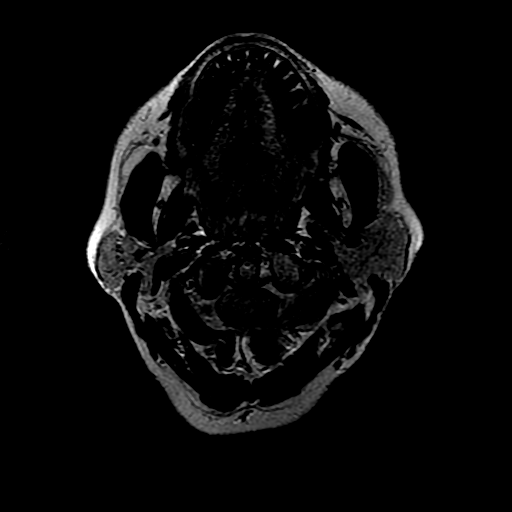

[Series 9: T1 · axial · 1.0mm · 0.47mm/px · z∈[-40,+67]mm · 3 of 162 slices shown (2 of 2)]
[im 1/162]
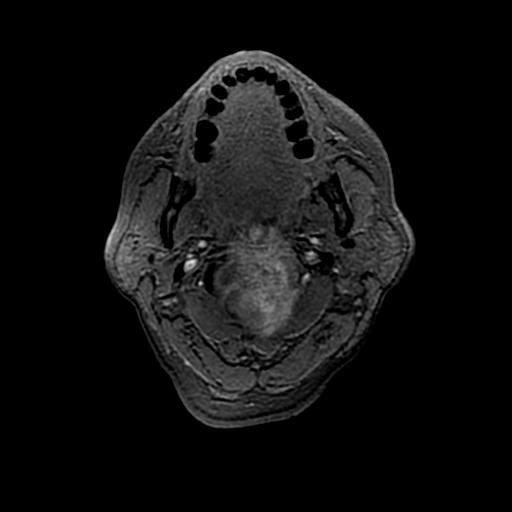
[im 54/162]
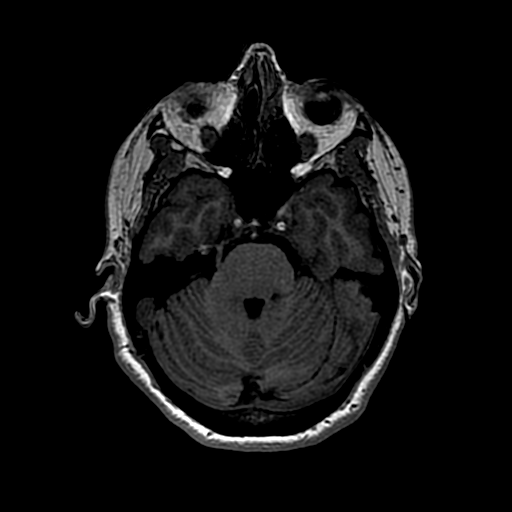
[im 108/162]
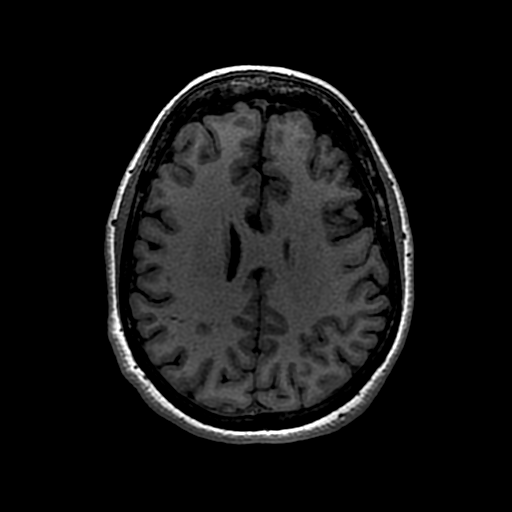

[Series 10: DWI · coronal · 4.0mm · 1.09mm/px · 2 of 82 slices shown (2 of 4)]
[im 1/82]
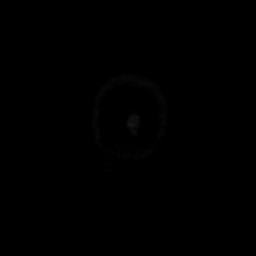
[im 82/82]
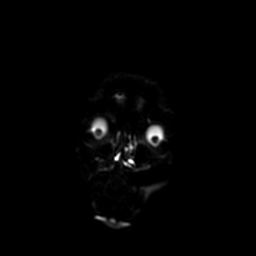

[Series 11: T2 · coronal · 5.0mm · 0.45mm/px · 1 of 26 slices shown (2 of 2)]
[im 1/26]
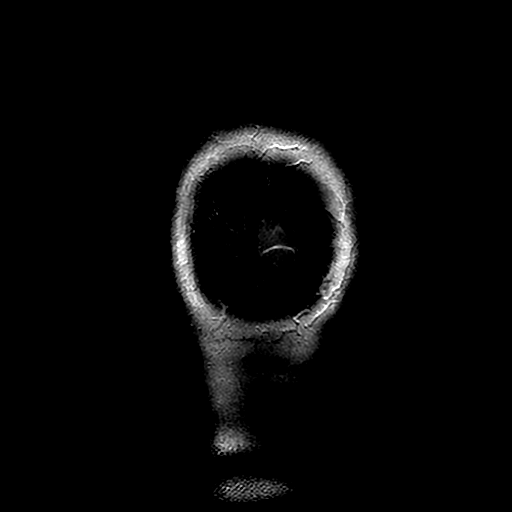

[Series 12: FLAIR · sagittal · 1.8mm · 0.43mm/px · 4 of 160 slices shown (2 of 2)]
[im 1/160]
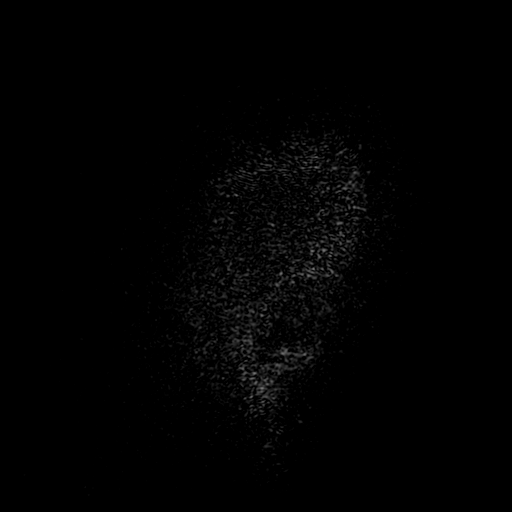
[im 54/160]
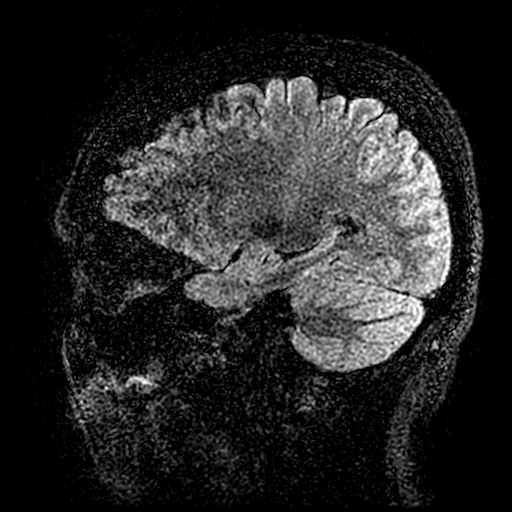
[im 107/160]
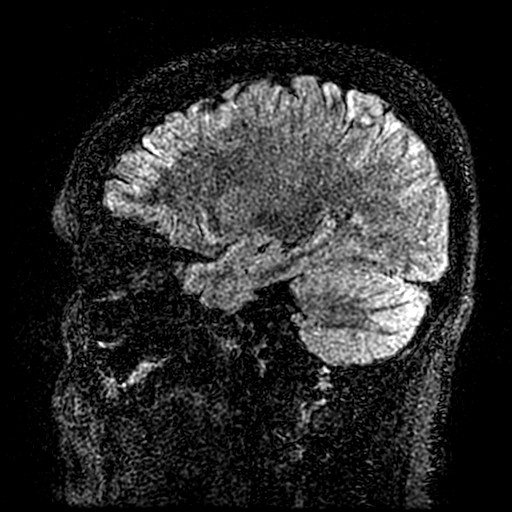
[im 160/160]
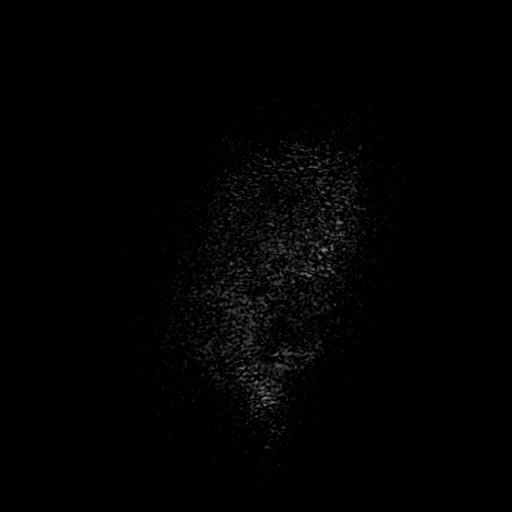

[Series 19: T1 post-contrast · axial · 1.0mm · 0.47mm/px · z∈[-40,+120]mm · 4 of 162 slices shown (1 of 2)]
[im 1/162]
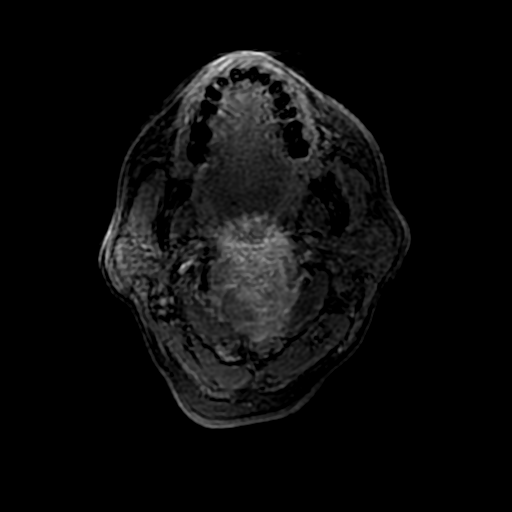
[im 54/162]
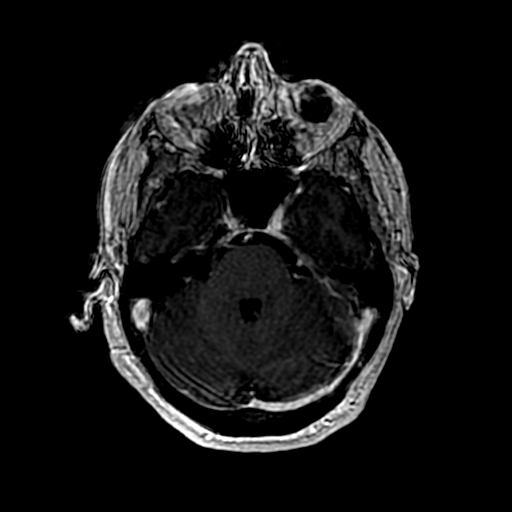
[im 108/162]
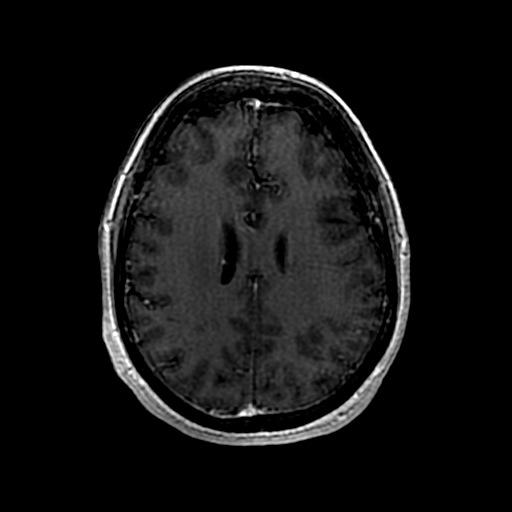
[im 162/162]
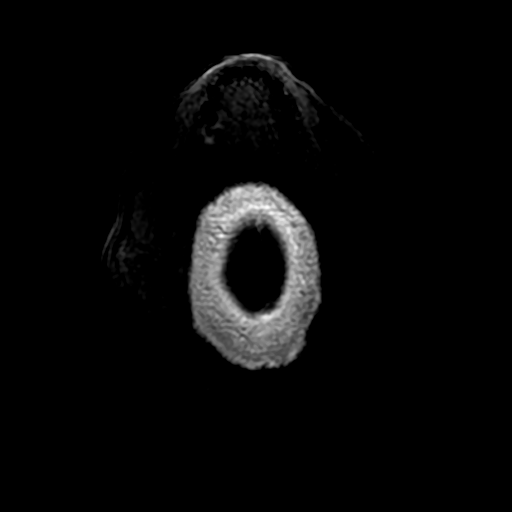

[Series 20: T1 post-contrast · coronal · 5.0mm · 0.43mm/px · 1 of 27 slices shown (2 of 2)]
[im 1/27]
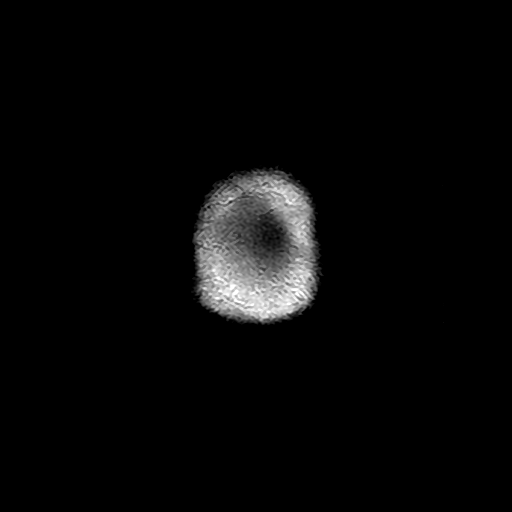

[Series 300: DWI · axial · 3.0mm · 1.09mm/px · 1 of 52 slices shown (3 of 4)]
[im 1/52]
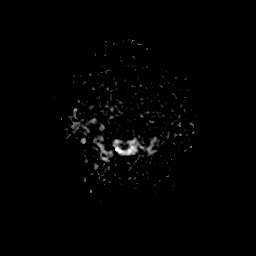

[Series 1000: DWI · coronal · 4.0mm · 1.09mm/px · 1 of 41 slices shown (4 of 4)]
[im 1/41]
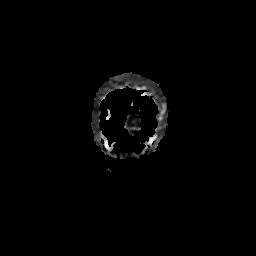

[22 of 48 positions shown; findings below may reference images not displayed]

FINDINGS: MRI HEAD FINDINGS

Brain: Negative for acute infarct. Few small subcortical white
matter hyperintensities bilaterally. Brainstem and cerebellum
normal. Negative for hemorrhage or mass. Ventricle size normal.

Postcontrast images degraded by significant motion. Allowing for
motion, no pathologic enhancement is identified.

Vascular: Normal arterial flow voids.

Skull and upper cervical spine: Negative

Sinuses/Orbits: Mild mucosal edema paranasal sinuses.  Normal orbit

Other: None

MRA HEAD FINDINGS

Left vertebral artery dominant and supplies the basilar. Right
vertebral artery is small and ends in PICA. Basilar widely patent.
PICA patent bilaterally. Superior cerebellar and posterior cerebral
arteries patent bilaterally. Small PCOM patent bilaterally.

Internal carotid artery widely patent bilaterally. Anterior and
middle cerebral arteries widely patent bilaterally.

Negative for cerebral aneurysm.

MRA NECK FINDINGS

Normal aortic arch.  Proximal great vessels widely patent.

Carotid artery widely patent bilaterally. Negative for stenosis or
irregularity.

Left vertebral dominant and widely patent to the basilar. Small
right vertebral artery ends in PICA without significant stenosis.
IMPRESSION: 1. Negative for acute infarct. Few small subcortical white matter
hyperintensities likely due to chronic microvascular ischemia.
Correlate with risk factors.
2. Negative MRA head
3. Negative MRA neck

## 2020-04-08 ENCOUNTER — Ambulatory Visit: Payer: 59 | Admitting: Emergency Medicine

## 2020-04-13 LAB — HM DIABETES EYE EXAM

## 2020-05-01 ENCOUNTER — Encounter: Payer: Self-pay | Admitting: Emergency Medicine

## 2020-05-04 ENCOUNTER — Encounter: Payer: Self-pay | Admitting: Emergency Medicine
